# Patient Record
Sex: Female | Born: 1960 | Race: White | Hispanic: Yes | Marital: Single | Smoking: Never smoker
Health system: Southern US, Community
[De-identification: ages and names within clinical notes are randomized; demographics above are authoritative.]

## PROBLEM LIST (undated history)

## (undated) DIAGNOSIS — K219 Gastro-esophageal reflux disease without esophagitis: Secondary | ICD-10-CM

## (undated) DIAGNOSIS — Z780 Asymptomatic menopausal state: Secondary | ICD-10-CM

## (undated) DIAGNOSIS — I10 Essential (primary) hypertension: Secondary | ICD-10-CM

## (undated) HISTORY — PX: OTHER SURGICAL HISTORY: SHX169

---

## 2003-01-06 ENCOUNTER — Encounter (INDEPENDENT_AMBULATORY_CARE_PROVIDER_SITE_OTHER): Payer: Self-pay | Admitting: *Deleted

## 2003-01-06 ENCOUNTER — Other Ambulatory Visit: Admission: RE | Admit: 2003-01-06 | Discharge: 2003-01-06 | Payer: Self-pay | Admitting: Family Medicine

## 2003-01-06 ENCOUNTER — Encounter: Admission: RE | Admit: 2003-01-06 | Discharge: 2003-01-06 | Payer: Self-pay | Admitting: Family Medicine

## 2003-01-17 ENCOUNTER — Ambulatory Visit (HOSPITAL_COMMUNITY): Admission: RE | Admit: 2003-01-17 | Discharge: 2003-01-17 | Payer: Self-pay | Admitting: Family Medicine

## 2003-02-08 ENCOUNTER — Encounter: Admission: RE | Admit: 2003-02-08 | Discharge: 2003-02-08 | Payer: Self-pay | Admitting: Obstetrics and Gynecology

## 2003-02-09 ENCOUNTER — Ambulatory Visit (HOSPITAL_COMMUNITY): Admission: RE | Admit: 2003-02-09 | Discharge: 2003-02-09 | Payer: Self-pay | Admitting: Obstetrics and Gynecology

## 2003-02-09 ENCOUNTER — Encounter (INDEPENDENT_AMBULATORY_CARE_PROVIDER_SITE_OTHER): Payer: Self-pay

## 2003-03-08 ENCOUNTER — Encounter: Admission: RE | Admit: 2003-03-08 | Discharge: 2003-03-08 | Payer: Self-pay | Admitting: Obstetrics and Gynecology

## 2004-09-30 DIAGNOSIS — I10 Essential (primary) hypertension: Secondary | ICD-10-CM

## 2004-09-30 HISTORY — DX: Essential (primary) hypertension: I10

## 2009-03-25 ENCOUNTER — Emergency Department (HOSPITAL_COMMUNITY): Admission: EM | Admit: 2009-03-25 | Discharge: 2009-03-26 | Payer: Self-pay | Admitting: Emergency Medicine

## 2009-03-28 ENCOUNTER — Emergency Department (HOSPITAL_COMMUNITY): Admission: EM | Admit: 2009-03-28 | Discharge: 2009-03-28 | Payer: Self-pay | Admitting: Emergency Medicine

## 2011-01-07 LAB — CBC
MCV: 87.5 fL (ref 78.0–100.0)
RBC: 4.91 MIL/uL (ref 3.87–5.11)
WBC: 9 10*3/uL (ref 4.0–10.5)

## 2011-01-07 LAB — DIFFERENTIAL
Lymphs Abs: 1.7 10*3/uL (ref 0.7–4.0)
Monocytes Relative: 7 % (ref 3–12)
Neutro Abs: 6.5 10*3/uL (ref 1.7–7.7)
Neutrophils Relative %: 73 % (ref 43–77)

## 2011-01-07 LAB — COMPREHENSIVE METABOLIC PANEL
ALT: 16 U/L (ref 0–35)
Alkaline Phosphatase: 73 U/L (ref 39–117)
CO2: 29 mEq/L (ref 19–32)
Chloride: 111 mEq/L (ref 96–112)
GFR calc non Af Amer: >60 mL/min (ref >60)
Glucose, Bld: 97 mg/dL (ref 70–99)
Potassium: 3.8 mEq/L (ref 3.5–5.1)
Sodium: 146 mEq/L — ABNORMAL HIGH (ref 135–145)

## 2011-01-07 LAB — URINALYSIS, ROUTINE W REFLEX MICROSCOPIC
Bilirubin Urine: NEGATIVE
Glucose, UA: NEGATIVE mg/dL
Hgb urine dipstick: NEGATIVE
Ketones, ur: NEGATIVE mg/dL
Nitrite: NEGATIVE
Protein, ur: NEGATIVE mg/dL
Urobilinogen, UA: 0.2 mg/dL (ref 0.0–1.0)

## 2011-01-07 LAB — LIPASE, BLOOD: Lipase: 28 U/L (ref 11–59)

## 2011-01-07 LAB — URINE MICROSCOPIC-ADD ON

## 2011-02-15 NOTE — Op Note (Signed)
NAMECHRISTALYNN, Courtney Randolph                           ACCOUNT NO.:  [id number]   MEDICAL RECORD NO.:  [id number]                   PATIENT TYPE:  AMB   LOCATION:  SDC                                  FACILITY:  [facility]   PHYSICIAN:  Phil D. Okey Dupre, M.D.                  DATE OF BIRTH:  06/15/1961   DATE OF PROCEDURE:  02/09/2003  DATE OF DISCHARGE:                                 OPERATIVE REPORT   PROCEDURES:  1. Dilatation and curettage.  2. Submucous myomectomy.  3. Hysteroscopy.   SURGEON:  Javier Glazier. Okey Dupre, M.D.   ANESTHESIA:  Heavy MAC with paracervical.   OPERATIVE FINDINGS:  A 4 cm leiomyoma completely outside of the cervix with  a stalk that could be palpated midway up into the uterine cavity and  inserting on the anterior wall.   DESCRIPTION OF PROCEDURE:  Under satisfactory heavy MAC sedation, the  patient in the dorsal lithotomy position, the perineum and vagina were  prepped and draped in the usual sterile manner.  With bimanual pelvic  examination were able to see the aborting fibroid, follow the stalk up to  the mid anterior portion of the fundus, and the uterus was seen to be of  normal size and consistency with normal adnexa.  A weighted speculum was  placed up the posterior fourchette of the vagina, and the fibroid was  grasped with a single-tooth tenaculum.  Xylocaine 5 mL, 1%, was placed in  each of the paracervical areas at 8 and 4 o'clock for paracervical blockage.  An Endoloop was then placed around the fibroid and pushed up as high as  possible to the end of the stalk and pulled down as tight as possible.  Then  using a Mayo scissors, the fibroid was cut away from the stalk to be sent  for pathologic diagnosis.  The remaining portion of the stalk was then  twisted off with a polyp forceps and the entire uterine cavity curetted with  a small serrated curette.  This was followed by curettage with a small sharp  curette.  A large amount of endometrial tissue was sent for  pathologic  diagnosis.  The 30% hysteroscope was then inserted into the cavity to look  for the base of the stalk, and the area seemed to be smooth with no  significant amount of bleeding.  The tenaculum and speculum were removed  from the vagina.  The patient was transferred to the recovery room with a 20  mL blood loss, having tolerated the procedure well.  The endometrial  curettings as well as the leiomyoma were sent for pathologic diagnosis.                                               Michele Mcalpine  Amada Jupiter, M.D.    PDR/MEDQ  D:  02/09/2003  T:  02/10/2003  Job:  [id number]

## 2011-09-14 DIAGNOSIS — S301XXA Contusion of abdominal wall, initial encounter: Secondary | ICD-10-CM | POA: Insufficient documentation

## 2011-09-14 DIAGNOSIS — R109 Unspecified abdominal pain: Secondary | ICD-10-CM | POA: Insufficient documentation

## 2011-09-14 DIAGNOSIS — M549 Dorsalgia, unspecified: Secondary | ICD-10-CM | POA: Insufficient documentation

## 2011-09-15 ENCOUNTER — Encounter: Payer: Self-pay | Admitting: *Deleted

## 2011-09-15 ENCOUNTER — Emergency Department (HOSPITAL_COMMUNITY)
Admission: EM | Admit: 2011-09-15 | Discharge: 2011-09-15 | Disposition: A | Discharge status: Home / Self Care | Payer: Self-pay | Attending: Emergency Medicine | Admitting: Emergency Medicine

## 2011-09-15 DIAGNOSIS — S301XXA Contusion of abdominal wall, initial encounter: Secondary | ICD-10-CM

## 2011-09-15 MED ORDER — KETOROLAC TROMETHAMINE 60 MG/2ML IM SOLN
60.0000 mg | Freq: Once | INTRAMUSCULAR | Status: AC
  Administered 2011-09-15: 60 mg via INTRAMUSCULAR
  Filled 2011-09-15: qty 2

## 2011-09-15 MED ORDER — NAPROXEN 500 MG PO TABS: 500.0000 mg | ORAL_TABLET | Freq: Two times a day (BID) | ORAL | Status: AC
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
## 2011-12-18 ENCOUNTER — Other Ambulatory Visit (HOSPITAL_COMMUNITY): Payer: Self-pay | Admitting: Geriatric Medicine

## 2011-12-18 DIAGNOSIS — Z1231 Encounter for screening mammogram for malignant neoplasm of breast: Secondary | ICD-10-CM

## 2012-01-15 ENCOUNTER — Ambulatory Visit (HOSPITAL_COMMUNITY)
Admission: RE | Admit: 2012-01-15 | Discharge: 2012-01-15 | Disposition: A | Discharge status: Home / Self Care | Payer: No Typology Code available for payment source | Source: Ambulatory Visit | Attending: Geriatric Medicine | Admitting: Geriatric Medicine

## 2012-01-15 DIAGNOSIS — Z1231 Encounter for screening mammogram for malignant neoplasm of breast: Secondary | ICD-10-CM

## 2012-01-29 ENCOUNTER — Inpatient Hospital Stay (HOSPITAL_COMMUNITY): Payer: Self-pay

## 2012-01-29 ENCOUNTER — Inpatient Hospital Stay (HOSPITAL_COMMUNITY)
Admission: AD | Admit: 2012-01-29 | Discharge: 2012-01-29 | Disposition: A | Discharge status: Home / Self Care | Payer: Self-pay | Source: Ambulatory Visit | Attending: Obstetrics and Gynecology | Admitting: Obstetrics and Gynecology

## 2012-01-29 ENCOUNTER — Encounter (HOSPITAL_COMMUNITY): Payer: Self-pay | Admitting: *Deleted

## 2012-01-29 DIAGNOSIS — K8020 Calculus of gallbladder without cholecystitis without obstruction: Secondary | ICD-10-CM | POA: Insufficient documentation

## 2012-01-29 DIAGNOSIS — N912 Amenorrhea, unspecified: Secondary | ICD-10-CM | POA: Insufficient documentation

## 2012-01-29 DIAGNOSIS — R1011 Right upper quadrant pain: Secondary | ICD-10-CM

## 2012-01-29 HISTORY — DX: Essential (primary) hypertension: I10

## 2012-01-29 LAB — URINE MICROSCOPIC-ADD ON

## 2012-01-29 LAB — URINALYSIS, ROUTINE W REFLEX MICROSCOPIC
Glucose, UA: NEGATIVE mg/dL
Hgb urine dipstick: NEGATIVE
Specific Gravity, Urine: 1.015 (ref 1.005–1.030)
Urobilinogen, UA: 0.2 mg/dL (ref 0.0–1.0)

## 2012-01-29 LAB — CBC
MCV: 84.2 fL (ref 78.0–100.0)
Platelets: 219 10*3/uL (ref 150–400)
RBC: 5.14 MIL/uL — ABNORMAL HIGH (ref 3.87–5.11)
WBC: 8.7 10*3/uL (ref 4.0–10.5)

## 2012-01-29 LAB — HEPATIC FUNCTION PANEL
ALT: 15 U/L (ref 0–35)
AST: 15 U/L (ref 0–37)
Albumin: 3.9 g/dL (ref 3.5–5.2)
Bilirubin, Direct: <0.1 mg/dL (ref 0.0–0.3)

## 2012-01-29 LAB — POCT PREGNANCY, URINE: Preg Test, Ur: NEGATIVE

## 2012-01-29 MED ORDER — OXYCODONE-ACETAMINOPHEN 5-325 MG PO TABS: 1.0000 | ORAL_TABLET | Freq: Four times a day (QID) | ORAL | Status: AC | PRN

## 2012-01-29 NOTE — MAU Provider Note (Signed)
Courtney Randolph [id number] LMP 2 years ago. Post-menopausal Chief Complaint  Patient presents with  . Abdominal Pain     First Provider Initiated Contact with Patient 01/29/12 1049      SUBJECTIVE  HPI: Pt states she has a cyst in ruq that she has had for 2 years. States she's never had an ultrasound but states it has grown and is now painful. States she has had the pain x1 month and the pain now radiates towards her back.  She has taken Naproxen w/ good pain relief, but stopped taking due to stomach discomfort. She denies fever, chills, UTI Sx, hematuria, N/V/D/C, epigastric pain or relationship btw pain and eating.CT in 2010 for same compliant was normal.   Past Medical History  Diagnosis Date  . Hypertension    Past Surgical History  Procedure Date  . Uterine cyst removed    History   Social History  . Marital Status: Single    Spouse Name: N/A    Number of Children: N/A  . Years of Education: N/A   Occupational History  . Not on file.   Social History Main Topics  . Smoking status: Never Smoker   . Smokeless tobacco: Not on file  . Alcohol Use: No  . Drug Use: No  . Sexually Active: No   Other Topics Concern  . Not on file   Social History Narrative  . No narrative on file   No current facility-administered medications on file prior to encounter.   Current Outpatient Prescriptions on File Prior to Encounter  Medication Sig Dispense Refill  . naproxen (NAPROSYN) 500 MG tablet Take 1 tablet (500 mg total) by mouth 2 (two) times daily with a meal.  30 tablet  0   No Known Allergies  ROS: Pertinent items in HPI  OBJECTIVE Blood pressure 149/94, pulse 66, resp. rate 18, height 4\' 11"  (1.499 m), weight 68.04 kg (150 lb).  GENERAL: Well-developed, well-nourished female in mild distress.  HEENT: Normocephalic HEART: normal rate RESP: normal effort ABDOMEN: Soft, non-tender. No mass palpated at rest, w/ valsalva or when sitting. Pt unable to replicate bulging  of mass that she experienced at home. BS normal.  EXTREMITIES: Nontender, no edema NEURO: Alert and oriented SPECULUM EXAM: Deferred  LAB RESULTS Recent Results (from the past 336 hour(s))  URINALYSIS, ROUTINE W REFLEX MICROSCOPIC   Collection Time   01/29/12 10:20 AM      Component Value Range   Color, Urine YELLOW  YELLOW    APPearance CLEAR  CLEAR    Specific Gravity, Urine 1.015  1.005 - 1.030    pH 6.0  5.0 - 8.0    Glucose, UA NEGATIVE  NEGATIVE (mg/dL)   Hgb urine dipstick NEGATIVE  NEGATIVE    Bilirubin Urine NEGATIVE  NEGATIVE    Ketones, ur NEGATIVE  NEGATIVE (mg/dL)   Protein, ur NEGATIVE  NEGATIVE (mg/dL)   Urobilinogen, UA 0.2  0.0 - 1.0 (mg/dL)   Nitrite NEGATIVE  NEGATIVE    Leukocytes, UA TRACE (*) NEGATIVE   URINE MICROSCOPIC-ADD ON   Collection Time   01/29/12 10:20 AM      Component Value Range   Squamous Epithelial / LPF FEW (*) RARE    WBC, UA 0-2  <3 (WBC/hpf)   RBC / HPF 0-2  <3 (RBC/hpf)   Bacteria, UA FEW (*) RARE   POCT PREGNANCY, URINE   Collection Time   01/29/12 10:34 AM      Component Value Range   Preg  Test, Ur NEGATIVE  NEGATIVE   CBC   Collection Time   01/29/12 11:10 AM      Component Value Range   WBC 8.7  4.0 - 10.5 (K/uL)   RBC 5.14 (*) 3.87 - 5.11 (MIL/uL)   Hemoglobin 14.4  12.0 - 15.0 (g/dL)   HCT 16.1  09.6 - 04.5 (%)   MCV 84.2  78.0 - 100.0 (fL)   MCH 28.0  26.0 - 34.0 (pg)   MCHC 33.3  30.0 - 36.0 (g/dL)   RDW 40.9  81.1 - 91.4 (%)   Platelets 219  150 - 400 (K/uL)  HEPATIC FUNCTION PANEL   Collection Time   01/29/12 11:10 AM      Component Value Range   Total Protein 7.1  6.0 - 8.3 (g/dL)   Albumin 3.9  3.5 - 5.2 (g/dL)   AST 15  0 - 37 (U/L)   ALT 15  0 - 35 (U/L)   Alkaline Phosphatase 93  39 - 117 (U/L)   Total Bilirubin 0.5  0.3 - 1.2 (mg/dL)   Bilirubin, Direct <7.8  0.0 - 0.3 (mg/dL)   Indirect Bilirubin NOT CALCULATED  0.3 - 0.9 (mg/dL)   IMAGING US Abdomen Complete  01/29/2012  *RADIOLOGY REPORT*  Clinical Data:   Right abdominal pain and possible mass.  ABDOMINAL ULTRASOUND COMPLETE  Comparison:  None.  Findings:  Gallbladder:  A 4 mm echogenic focus is seen which is no longer demonstrated when the patient changes positions, likely representing a tiny mobile gallstone.  No evidence of gallbladder wall thickening or pericholecystic fluid.  Common Bile Duct:  Within normal limits in caliber. Measures 3 mm in diameter.  Liver: No focal mass lesion identified.  Within normal limits in parenchymal echogenicity.  IVC:  Appears normal.  Pancreas:  No abnormality identified.  Spleen:  Within normal limits in size and echotexture.  Right kidney:  Normal in size and parenchymal echogenicity.  No evidence of mass or hydronephrosis.  Left kidney:  Normal in size and parenchymal echogenicity.  No evidence of mass or hydronephrosis.  Abdominal Aorta:  No aneurysm identified.  IMPRESSION: Probable single tiny gallstone.  No sonographic signs of acute cholecystitis, biliary dilatation, or other significant abnormality.  Original Report Authenticated By: Danae Orleans, M.D.   ASSESSMENT 1. RUQ pain   of unknown etiology  PLAN D/C home Follow-up Information    Follow [facility] with [facility]   Contact information:   [address] [place] [address] [phone]      Follow up with [facility]. (As needed if symptoms worsen)    Contact information:   [address] [place] [address]         Medication List  As of 02/04/2012 12:04 AM   START taking these medications         oxyCODONE-acetaminophen 5-325 MG per tablet   Commonly known as: PERCOCET   Take 1-2 tablets by mouth every 6 (six) hours as needed for pain.         CONTINUE taking these medications         naproxen 500 MG tablet   Commonly known as: NAPROSYN   Take 1 tablet (500 mg total) by mouth 2 (two) times daily with a meal.          Where to get your medications    These are the prescriptions that  you need to pick up.   You may get these medications  from any pharmacy.         oxyCODONE-acetaminophen 5-325 MG per tablet           Take Naproxen w/ food and only when needed. Stop if stomach discomfort returns.  Dorathy Kinsman 01/29/2012 10:50 AM

## 2012-01-29 NOTE — MAU Note (Signed)
Pt states she has a cyst in ruq that she has had for 2 years.  States she's never had an ultrasound but states it has grown and is now painful.  States she has had the pain x1 month and the pain now radiates towards back.  Reports pain in left hip when walking.  LMP 2 years ago, assumes it is menopause.

## 2012-01-29 NOTE — Discharge Instructions (Signed)
Take Miralax as directed.

## 2012-02-05 NOTE — MAU Provider Note (Signed)
Agree with above note.  Courtney Randolph 02/05/2012 8:21 AM   

## 2012-05-14 ENCOUNTER — Encounter: Payer: Self-pay | Admitting: Family Medicine

## 2012-05-14 ENCOUNTER — Ambulatory Visit (INDEPENDENT_AMBULATORY_CARE_PROVIDER_SITE_OTHER): Payer: Self-pay | Admitting: Family Medicine

## 2012-05-14 VITALS — BP 136/84 | HR 80 | Temp 98.0°F | Ht 59.0 in | Wt 149.4 lb

## 2012-05-14 DIAGNOSIS — I10 Essential (primary) hypertension: Secondary | ICD-10-CM | POA: Insufficient documentation

## 2012-05-14 DIAGNOSIS — R109 Unspecified abdominal pain: Secondary | ICD-10-CM | POA: Insufficient documentation

## 2012-05-14 LAB — POCT URINALYSIS DIPSTICK
Bilirubin, UA: NEGATIVE
Glucose, UA: NEGATIVE
Ketones, UA: NEGATIVE
Leukocytes, UA: NEGATIVE

## 2012-05-14 MED ORDER — TRAMADOL HCL 50 MG PO TABS: 50.0000 mg | ORAL_TABLET | Freq: Three times a day (TID) | ORAL | Status: AC | PRN

## 2012-05-14 NOTE — Assessment & Plan Note (Signed)
On Lisinopril 10mg . Not sure who prescribed this medication, but patient's BP was 136/84 today. Will continue Lisinopril 10mg  for now. If patient develops HA, changes in vision, CP, SOB or leg swelling she should be evaluated.

## 2012-05-14 NOTE — Assessment & Plan Note (Signed)
UA ordered and no signs of UTI. Given tramadol for pain. Encouraged to use heat as there may be a MSK component. U/S and labs reviewed and were unremarkable. Patient does not have an acute abdomen. Will treat symptoms. Return to clinic soon for pelvic exam with GC/Ch cultures for possible pelvic etiology.

## 2012-05-14 NOTE — Progress Notes (Signed)
Subjective:     Patient ID: Courtney Randolph, female   DOB: [birth year], 51 y.o.   MRN: [id number]  HPI 51 yo F presenting to establish care. Never had PCP. Patient's nephew is present as interpreter. Patient has signed paper requesting he be present.  Abd Pain: A few months, RUQ radiates to back. Nothing makes it better, worse with working in tight clothes. Sitting a long time also makes it worse. Worse with eating. Causes nausea. Worse with greasy food. Evaluated at [facility] in May with an ultrasound. Unsure etiology, but not GYN issues per their evaluation. Patient also was evaluated in 2010 with CT scan and other work up which was unremarkable. Patient denies dysuria, vaginal discharge or any other gyn symptoms. Patient had IUD in 2010, but states she no longer has this. Her pain does not appear to interfere with her daily activities but it is always present.  HTN: Unsure of when and where she was diagnosed, but patient is on Lisinopril 10mg . She denies HA, changes in vision, CP or leg edema. She has no side effects from the medication. She does not check her BP at home.  Past Medical History  Diagnosis Date  . Hypertension    Past Surgical History  Procedure Date  . Uterine cyst removed     2006   Family History  Problem Relation Age of Onset  . Anesthesia problems Neg Hx    History   Social History Narrative   Lives alone in [place]. Has good support network in town including family members. Works at [employer].   Does not smoke, drink EtOH or use drugs.  Review of Systems See HPI above. Otherwise, ROS negative    Objective:   Physical Exam  Constitutional: She appears well-developed and well-nourished. No distress.  HENT:  Head: Normocephalic and atraumatic.  Neck: Normal range of motion.  Cardiovascular: Normal rate, regular rhythm and normal heart sounds.   Pulmonary/Chest: Effort normal and breath sounds normal. No respiratory distress.  Abdominal: Soft.  Bowel sounds are normal.       Obese abdomen. Soft, nondistended. She is TTP RUQ/RLQ but not exquisitely tender. Unable to identify one specific location of pain. No rebound. No CVA tenderness.  Musculoskeletal: Normal range of motion. She exhibits no edema.  Lymphadenopathy:    She has no cervical adenopathy.  Neurological: She is alert.  Skin: Skin is warm and dry. No rash noted.      Assessment:    51 yo F with HTN presenting for evaluation of abdominal pain   Plan:

## 2012-05-14 NOTE — Patient Instructions (Signed)
It was nice to meet you today.  I have changed your pain medication. Please make an appointment to come back to see me soon for a pelvic exam to evaluate this pain. I do not think you need surgery right now, but we will continue to try to treat your pain.   It also seems like you may have some muscle strain in your back causing your pain. You can use a heating pad for this.  I will see you in 1-2 months or sooner, if needed.   Amber M. Hairford, M.D.

## 2012-06-04 ENCOUNTER — Ambulatory Visit (INDEPENDENT_AMBULATORY_CARE_PROVIDER_SITE_OTHER): Payer: Self-pay | Admitting: Family Medicine

## 2012-06-04 DIAGNOSIS — I10 Essential (primary) hypertension: Secondary | ICD-10-CM

## 2012-06-04 DIAGNOSIS — R109 Unspecified abdominal pain: Secondary | ICD-10-CM

## 2012-06-04 NOTE — Progress Notes (Signed)
Subjective:     Patient ID: Courtney Randolph, female   DOB: 1961-03-11, 51 y.o.   MRN: [id number]  HPI Pt is a 51 yo female presenting for follow up on her abd pain. [employer] was available as an Equities trader for the entire interview and exam  Pain not better. Tried Tramadol which only helped some. Able to eat, but feels like she has some heartburn which is not related to current pain. Takes alkaseltzer some times but does not help. Still washing dishes but does not hurt her abdomen more with twisting. No other exercise or heavy lifting. Reports pain in RUQ that radiates to her back. She feels a "lump" but it is not visible. She does wear some tight clothing at work, but not at home and the pain is always there. She does not report a rash or sensitivity of the skin.   History reviewed: Nonsmoker  Review of Systems + pain, no N/V/D, no rash    Objective:   Physical Exam Gen: Awake, sitting on table, leaning to the left HEENT: AT, [place] Heart: RRR Lungs: CTAB Abd: No visible rash. Not very TTP. No tenderness of ribs. No masses or lumps appreciated. Ext: No edema    Assessment:     51 yo F with chronic abdominal pain    Plan:     See problem list

## 2012-06-04 NOTE — Patient Instructions (Addendum)
Thanks for coming in.  Please get your orange card. Then go to hospital and get an Xray. Please ask your pharmacist for the generic Prilosec for your heart burn. You can take ibuprofen 2 tablets up to 4 times per day, if needed for pain.  Please come back to see me in 1-2 months after you get orange card.  Thanks, [employer]. Trevis Eden, M.D.

## 2012-06-05 ENCOUNTER — Encounter: Payer: Self-pay | Admitting: Family Medicine

## 2012-06-05 NOTE — Assessment & Plan Note (Signed)
Unremarkable abdominal exam. Patient awaiting orange card. Will do pelvic exam with cultures after orange card. Also, patient will likely benefit from a HIDA scan to see if the EF from her gallbladder is low which could be causing her pain since no stone was found. If this is the case, she will need a cholecystectomy

## 2012-06-19 ENCOUNTER — Other Ambulatory Visit (HOSPITAL_COMMUNITY)
Admission: RE | Admit: 2012-06-19 | Discharge: 2012-06-19 | Disposition: A | Discharge status: Home / Self Care | Payer: Self-pay | Source: Ambulatory Visit | Attending: Family Medicine | Admitting: Family Medicine

## 2012-06-19 ENCOUNTER — Ambulatory Visit (INDEPENDENT_AMBULATORY_CARE_PROVIDER_SITE_OTHER): Payer: Self-pay | Admitting: Family Medicine

## 2012-06-19 ENCOUNTER — Encounter: Payer: Self-pay | Admitting: Family Medicine

## 2012-06-19 VITALS — BP 117/75 | HR 65 | Temp 98.5°F | Ht 59.0 in | Wt 147.7 lb

## 2012-06-19 DIAGNOSIS — Z23 Encounter for immunization: Secondary | ICD-10-CM

## 2012-06-19 DIAGNOSIS — Z113 Encounter for screening for infections with a predominantly sexual mode of transmission: Secondary | ICD-10-CM | POA: Insufficient documentation

## 2012-06-19 DIAGNOSIS — R109 Unspecified abdominal pain: Secondary | ICD-10-CM

## 2012-06-19 MED ORDER — HYDROCODONE-ACETAMINOPHEN 5-500 MG PO TABS: 1.0000 | ORAL_TABLET | Freq: Three times a day (TID) | ORAL | Status: DC | PRN

## 2012-06-19 NOTE — Progress Notes (Signed)
Patient ID: Courtney Randolph, female   DOB: Aug 29, 1961, 51 y.o.   MRN: [id number] [facility] Ramina Hulet M. Selia Wareing, MD Phone: [phone]  Subjective: HPI: Patient is a 51 y.o. female presenting to clinic today for abdominal pain. Interpreter present throughout history and physical.  1. Patient is being evaluated again for the same abdominal pain. Always has RUQ pain and radiates to the back. Unchanged. Her ribs do not hurt, but now it does radiate down her back more. She now has the [facility] card and is returning for further evaluation including pelvic exam and possible HIDA scan. Appetite is normal. Not affected by work. Nothing makes it better or worse. She describes pain as 8/10 today. She does take some pain medication but she states it makes her eyes red. No fevers, epigastric pain improved, only pain in RUQ.  Some nausea, no vomiting. No vaginal discharge.  History Reviewed: No smoker. Health Maintenance: Needs flu shot; will get today.   ROS: Please see HPI above.  Objective: Office vital signs reviewed.  Physical Examination:  General: Awake, alert. Appears uncomfortable HEENT: Atraumatic, normocephalic. Sclera injected bilaterally.  Pulm: CTAB, no wheezes. Normal work of breathing Cardio: RRR, no murmurs appreciated Abdomen:+BS, soft. No TTP of epigastrum. Exquisite pain to palpation of RUQ, right flank and RLQ. No rebound. No guarding. Unable to palpate liver edge. GU: Normal vaginal. No discharge. Unable to visualize cervix. GC/Ch cultures obtained Extremities: No edema Neuro: Grossly intact  Assessment: 51 yo Hispanic female with abdominal pain  Plan: See Problem List and After Visit Summary

## 2012-06-19 NOTE — Assessment & Plan Note (Signed)
Continues to have same pain, unchanged. The tramadol I gave her for musculoskeletal pain did not help. Only a small gallstone seen on ultrasound. I did pelvic today which was unremarkable and did Gc/Ch cultures. I think this is likely secondary to her gallbladder although her LFts were normal. Will refer to surgery for further evaluation and hopefully they will be able to arrange an outpatient HIDA scan for her. We discussed this, and she agrees. I have her #20 of Vicodin to take only as needed for pain and when she could rest. If surgery work up is unremarkable, will also consider evaluating pulmonary etiology of pain given the close proximity to her diaphragm.

## 2012-06-19 NOTE — Patient Instructions (Signed)
It was good to see you today. I am sorry you are still having pain. I will call you if your tests today are abnormal. I am giving you a short trial of pain medication. Only take this when you can rest. I do not want you to take this long term, but I can tell your abdomen is really bothering you. Also, I am putting in a referral for the surgeons to work up your pain as well.  Please come back to see me in 1-2 months.  Roby Spalla M. Colbert Curenton, M.D.

## 2012-07-03 ENCOUNTER — Encounter (INDEPENDENT_AMBULATORY_CARE_PROVIDER_SITE_OTHER): Payer: Self-pay | Admitting: Surgery

## 2012-07-03 ENCOUNTER — Ambulatory Visit (INDEPENDENT_AMBULATORY_CARE_PROVIDER_SITE_OTHER): Payer: PRIVATE HEALTH INSURANCE | Admitting: Surgery

## 2012-07-03 VITALS — BP 128/82 | HR 64 | Temp 98.4°F | Resp 14 | Ht 60.0 in | Wt 149.2 lb

## 2012-07-03 DIAGNOSIS — R1900 Intra-abdominal and pelvic swelling, mass and lump, unspecified site: Secondary | ICD-10-CM

## 2012-07-03 DIAGNOSIS — R222 Localized swelling, mass and lump, trunk: Secondary | ICD-10-CM

## 2012-07-03 NOTE — Progress Notes (Signed)
Patient ID: Courtney Randolph, female   DOB: 02-Dec-1960, 51 y.o.   MRN: [id number]  Chief Complaint  Patient presents with  . Other    gallbladder problems    HPI Courtney Randolph is a 51 y.o. female.    HPI She is here today referred by [facility] family practice for evaluation of right-sided abdominal pain. She has been hurting for approximately 2 years and reports that she has a mass on her right flank. She feels that this is causing the pain. She does not speak any English and this is done through an interpreter. She does have some fatty food intolerance and has had an ultrasound showing cholelithiasis. No abdominal masses were seen on ultrasound or previous CAT scan. The pain is worse with motion. It is described as sharp and worsening. Past Medical History  Diagnosis Date  . Hypertension     Past Surgical History  Procedure Date  . Uterine cyst removed     2006    Family History  Problem Relation Age of Onset  . Anesthesia problems Neg Hx     Social History History  Substance Use Topics  . Smoking status: Never Smoker   . Smokeless tobacco: Not on file  . Alcohol Use: No    No Known Allergies  Current Outpatient Prescriptions  Medication Sig Dispense Refill  . HYDROcodone-acetaminophen (VICODIN) 5-500 MG per tablet Take 1 tablet by mouth every 8 (eight) hours as needed for pain.  20 tablet  0  . lisinopril (PRINIVIL,ZESTRIL) 10 MG tablet Take 10 mg by mouth daily.      . naproxen (NAPROSYN) 500 MG tablet Take 1 tablet (500 mg total) by mouth 2 (two) times daily with a meal.  30 tablet  0  . traMADol (ULTRAM) 50 MG tablet Take 50 mg by mouth every 6 (six) hours as needed.        Review of Systems Review of Systems  Constitutional: Negative for fever, chills and unexpected weight change.  HENT: Negative for hearing loss, congestion, sore throat, trouble swallowing and voice change.   Eyes: Negative for visual disturbance.  Respiratory: Negative for cough and wheezing.     Cardiovascular: Negative for chest pain, palpitations and leg swelling.  Gastrointestinal: Positive for nausea and abdominal pain. Negative for vomiting, diarrhea, constipation, blood in stool, abdominal distention and anal bleeding.  Genitourinary: Negative for hematuria, vaginal bleeding and difficulty urinating.  Musculoskeletal: Negative for arthralgias.  Skin: Negative for rash and wound.  Neurological: Negative for seizures, syncope and headaches.  Hematological: Negative for adenopathy. Does not bruise/bleed easily.  Psychiatric/Behavioral: Negative for confusion.    Blood pressure 128/82, pulse 64, temperature 98.4 F (36.9 C), resp. rate 14, height 5' (1.524 m), weight 149 lb 3.2 oz (67.677 kg).  Physical Exam Physical Exam  Constitutional: She is oriented to person, place, and time. She appears well-developed and well-nourished. No distress.  HENT:  Head: Normocephalic and atraumatic.  Right Ear: External ear normal.  Left Ear: External ear normal.  Nose: Nose normal.  Mouth/Throat: Oropharynx is clear and moist.  Eyes: Conjunctivae normal are normal. Pupils are equal, round, and reactive to light. Right eye exhibits no discharge. Left eye exhibits no discharge. No scleral icterus.  Neck: Normal range of motion. Neck supple. No tracheal deviation present. No thyromegaly present.  Cardiovascular: Normal rate, regular rhythm, normal heart sounds and intact distal pulses.   No murmur heard. Pulmonary/Chest: Effort normal and breath sounds normal. No respiratory distress. She has no wheezes. She  has no rales.  Abdominal: Soft. Bowel sounds are normal. She exhibits no distension. There is no tenderness. There is no rebound.       There is a 5 cm mass deep in the subcutaneous tissue of the right upper quadrant/flank just below the costal margin.  Musculoskeletal: Normal range of motion. She exhibits no edema and no tenderness.  Lymphadenopathy:    She has no cervical adenopathy.   Neurological: She is alert and oriented to person, place, and time.  Skin: Skin is warm and dry. No rash noted. She is not diaphoretic. No erythema.  Psychiatric: Her behavior is normal. Judgment normal.    Data Reviewed   Assessment    A 5 cm right flank mass    Plan    Although she does have gallstones, she is convinced the mass at the cause of her discomfort which I feel is reasonable. This may represent a lipoma. She was to have this removed which I feel again is necessary for histologic evaluation. I discussed risks of surgery which includes but is not limited to bleeding, infection, recurrence, and the chances may not resolve all her symptoms. She understands and wishes to proceed. Surgery will be scheduled. Likely Of success is moderate       Courtney Randolph A 07/03/2012, 4:23 PM

## 2012-07-16 ENCOUNTER — Encounter (HOSPITAL_BASED_OUTPATIENT_CLINIC_OR_DEPARTMENT_OTHER): Payer: Self-pay | Admitting: *Deleted

## 2012-07-16 NOTE — Progress Notes (Signed)
Uses a spanish interpretor for call-to come in for [id number]-pt goes to [facility]- Sister to bring her dos-will have an interpretor

## 2012-07-17 ENCOUNTER — Encounter (HOSPITAL_BASED_OUTPATIENT_CLINIC_OR_DEPARTMENT_OTHER)
Admission: RE | Admit: 2012-07-17 | Discharge: 2012-07-17 | Disposition: A | Discharge status: Home / Self Care | Payer: Self-pay | Source: Ambulatory Visit | Attending: Surgery | Admitting: Surgery

## 2012-07-17 ENCOUNTER — Other Ambulatory Visit: Payer: Self-pay

## 2012-07-17 LAB — BASIC METABOLIC PANEL
CO2: 28 mEq/L (ref 19–32)
Calcium: 9.2 mg/dL (ref 8.4–10.5)
Chloride: 104 mEq/L (ref 96–112)
GFR calc Af Amer: >90 mL/min (ref >90)
GFR calc non Af Amer: >90 mL/min (ref >90)
Sodium: 141 mEq/L (ref 135–145)

## 2012-07-21 NOTE — H&P (Signed)
Patient ID: Courtney Randolph, female DOB: August 18, 1961, 51 y.o. MRN: [id number]  Chief Complaint   Patient presents with   .  Other     gallbladder problems    HPI  Courtney Randolph is a 51 y.o. female.  HPI  She is here today referred by [facility] family practice for evaluation of right-sided abdominal pain. She has been hurting for approximately 2 years and reports that she has a mass on her right flank. She feels that this is causing the pain. She does not speak any English and this is done through an interpreter. She does have some fatty food intolerance and has had an ultrasound showing cholelithiasis. No abdominal masses were seen on ultrasound or previous CAT scan. The pain is worse with motion. It is described as sharp and worsening.  Past Medical History   Diagnosis  Date   .  Hypertension     Past Surgical History   Procedure  Date   .  Uterine cyst removed      2006    Family History   Problem  Relation  Age of Onset   .  Anesthesia problems  Neg Hx     Social History  History   Substance Use Topics   .  Smoking status:  Never Smoker   .  Smokeless tobacco:  Not on file   .  Alcohol Use:  No    No Known Allergies  Current Outpatient Prescriptions   Medication  Sig  Dispense  Refill   .  HYDROcodone-acetaminophen (VICODIN) 5-500 MG per tablet  Take 1 tablet by mouth every 8 (eight) hours as needed for pain.  20 tablet  0   .  lisinopril (PRINIVIL,ZESTRIL) 10 MG tablet  Take 10 mg by mouth daily.     .  naproxen (NAPROSYN) 500 MG tablet  Take 1 tablet (500 mg total) by mouth 2 (two) times daily with a meal.  30 tablet  0   .  traMADol (ULTRAM) 50 MG tablet  Take 50 mg by mouth every 6 (six) hours as needed.      Review of Systems  Review of Systems  Constitutional: Negative for fever, chills and unexpected weight change.  HENT: Negative for hearing loss, congestion, sore throat, trouble swallowing and voice change.  Eyes: Negative for visual disturbance.  Respiratory:  Negative for cough and wheezing.  Cardiovascular: Negative for chest pain, palpitations and leg swelling.  Gastrointestinal: Positive for nausea and abdominal pain. Negative for vomiting, diarrhea, constipation, blood in stool, abdominal distention and anal bleeding.  Genitourinary: Negative for hematuria, vaginal bleeding and difficulty urinating.  Musculoskeletal: Negative for arthralgias.  Skin: Negative for rash and wound.  Neurological: Negative for seizures, syncope and headaches.  Hematological: Negative for adenopathy. Does not bruise/bleed easily.  Psychiatric/Behavioral: Negative for confusion.   Blood pressure 128/82, pulse 64, temperature 98.4 F (36.9 C), resp. rate 14, height 5' (1.524 m), weight 149 lb 3.2 oz (67.677 kg).  Physical Exam  Physical Exam  Constitutional: She is oriented to person, place, and time. She appears well-developed and well-nourished. No distress.  HENT:  Head: Normocephalic and atraumatic.  Right Ear: External ear normal.  Left Ear: External ear normal.  Nose: Nose normal.  Mouth/Throat: Oropharynx is clear and moist.  Eyes: Conjunctivae normal are normal. Pupils are equal, round, and reactive to light. Right eye exhibits no discharge. Left eye exhibits no discharge. No scleral icterus.  Neck: Normal range of motion. Neck supple. No tracheal deviation  present. No thyromegaly present.  Cardiovascular: Normal rate, regular rhythm, normal heart sounds and intact distal pulses.  No murmur heard.  Pulmonary/Chest: Effort normal and breath sounds normal. No respiratory distress. She has no wheezes. She has no rales.  Abdominal: Soft. Bowel sounds are normal. She exhibits no distension. There is no tenderness. There is no rebound.  There is a 5 cm mass deep in the subcutaneous tissue of the right upper quadrant/flank just below the costal margin.  Musculoskeletal: Normal range of motion. She exhibits no edema and no tenderness.  Lymphadenopathy:  She has  no cervical adenopathy.  Neurological: She is alert and oriented to person, place, and time.  Skin: Skin is warm and dry. No rash noted. She is not diaphoretic. No erythema.  Psychiatric: Her behavior is normal. Judgment normal.   Data Reviewed  Assessment   A 5 cm right flank mass   Plan   Although she does have gallstones, she is convinced the mass at the cause of her discomfort which I feel is reasonable. This may represent a lipoma. She was to have this removed which I feel again is necessary for histologic evaluation. I discussed risks of surgery which includes but is not limited to bleeding, infection, recurrence, and the chances may not resolve all her symptoms. She understands and wishes to proceed. Surgery will be scheduled. Likely Of success is moderate   Aaryn Sermon A

## 2012-07-22 ENCOUNTER — Encounter (HOSPITAL_BASED_OUTPATIENT_CLINIC_OR_DEPARTMENT_OTHER)
Admission: RE | Disposition: A | Discharge status: Home / Self Care | Payer: Self-pay | Source: Ambulatory Visit | Attending: Surgery

## 2012-07-22 ENCOUNTER — Ambulatory Visit (HOSPITAL_BASED_OUTPATIENT_CLINIC_OR_DEPARTMENT_OTHER)
Admission: RE | Admit: 2012-07-22 | Discharge: 2012-07-22 | Disposition: A | Discharge status: Home / Self Care | Payer: Self-pay | Source: Ambulatory Visit | Attending: Surgery | Admitting: Surgery

## 2012-07-22 ENCOUNTER — Encounter (HOSPITAL_BASED_OUTPATIENT_CLINIC_OR_DEPARTMENT_OTHER): Payer: Self-pay | Admitting: Anesthesiology

## 2012-07-22 ENCOUNTER — Encounter (HOSPITAL_BASED_OUTPATIENT_CLINIC_OR_DEPARTMENT_OTHER): Payer: Self-pay | Admitting: *Deleted

## 2012-07-22 ENCOUNTER — Ambulatory Visit (HOSPITAL_BASED_OUTPATIENT_CLINIC_OR_DEPARTMENT_OTHER): Payer: Self-pay | Admitting: Anesthesiology

## 2012-07-22 DIAGNOSIS — Z01812 Encounter for preprocedural laboratory examination: Secondary | ICD-10-CM | POA: Insufficient documentation

## 2012-07-22 DIAGNOSIS — R1909 Other intra-abdominal and pelvic swelling, mass and lump: Secondary | ICD-10-CM | POA: Insufficient documentation

## 2012-07-22 DIAGNOSIS — D1739 Benign lipomatous neoplasm of skin and subcutaneous tissue of other sites: Secondary | ICD-10-CM

## 2012-07-22 DIAGNOSIS — I10 Essential (primary) hypertension: Secondary | ICD-10-CM | POA: Insufficient documentation

## 2012-07-22 DIAGNOSIS — Z01810 Encounter for preprocedural cardiovascular examination: Secondary | ICD-10-CM | POA: Insufficient documentation

## 2012-07-22 HISTORY — PX: MASS EXCISION: SHX2000

## 2012-07-22 HISTORY — DX: Asymptomatic menopausal state: Z78.0

## 2012-07-22 LAB — POCT HEMOGLOBIN-HEMACUE: Hemoglobin: 15 g/dL (ref 12.0–15.0)

## 2012-07-22 SURGERY — EXCISION MASS
Anesthesia: General | Site: Abdomen | Laterality: Right | Wound class: Clean

## 2012-07-22 MED ORDER — FENTANYL CITRATE 0.05 MG/ML IJ SOLN
INTRAMUSCULAR | Status: DC | PRN
  Administered 2012-07-22: 75 ug via INTRAVENOUS

## 2012-07-22 MED ORDER — PROPOFOL 10 MG/ML IV BOLUS
INTRAVENOUS | Status: DC | PRN
  Administered 2012-07-22: 200 mg via INTRAVENOUS

## 2012-07-22 MED ORDER — CEFAZOLIN SODIUM-DEXTROSE 2-3 GM-% IV SOLR
2.0000 g | INTRAVENOUS | Status: AC
  Administered 2012-07-22: 2 g via INTRAVENOUS

## 2012-07-22 MED ORDER — EPHEDRINE SULFATE 50 MG/ML IJ SOLN
INTRAMUSCULAR | Status: DC | PRN
  Administered 2012-07-22: 10 mg via INTRAVENOUS

## 2012-07-22 MED ORDER — HYDROCODONE-ACETAMINOPHEN 5-500 MG PO TABS: 1.0000 | ORAL_TABLET | Freq: Three times a day (TID) | ORAL | Status: DC | PRN

## 2012-07-22 MED ORDER — ONDANSETRON HCL 4 MG/2ML IJ SOLN
INTRAMUSCULAR | Status: DC | PRN
  Administered 2012-07-22: 4 mg via INTRAVENOUS

## 2012-07-22 MED ORDER — LIDOCAINE HCL (CARDIAC) 20 MG/ML IV SOLN
INTRAVENOUS | Status: DC | PRN
  Administered 2012-07-22: 50 mg via INTRAVENOUS

## 2012-07-22 MED ORDER — BUPIVACAINE HCL (PF) 0.5 % IJ SOLN
INTRAMUSCULAR | Status: DC | PRN
  Administered 2012-07-22: 18 mL

## 2012-07-22 MED ORDER — DEXAMETHASONE SODIUM PHOSPHATE 4 MG/ML IJ SOLN
INTRAMUSCULAR | Status: DC | PRN
  Administered 2012-07-22: 5 mg via INTRAVENOUS

## 2012-07-22 MED ORDER — LACTATED RINGERS IV SOLN
INTRAVENOUS | Status: DC
  Administered 2012-07-22: 12:00:00 via INTRAVENOUS

## 2012-07-22 MED ORDER — MIDAZOLAM HCL 5 MG/5ML IJ SOLN
INTRAMUSCULAR | Status: DC | PRN
  Administered 2012-07-22: 1.5 mg via INTRAVENOUS

## 2012-07-22 SURGICAL SUPPLY — 40 items
BENZOIN TINCTURE PRP APPL 2/3 (GAUZE/BANDAGES/DRESSINGS) ×2 IMPLANT
BLADE HEX COATED 2.75 (ELECTRODE) ×2 IMPLANT
BLADE SURG 15 STRL LF DISP TIS (BLADE) ×1 IMPLANT
BLADE SURG 15 STRL SS (BLADE) ×1
BLADE SURG ROTATE 9660 (MISCELLANEOUS) IMPLANT
CANISTER SUCTION 1200CC (MISCELLANEOUS) IMPLANT
CHLORAPREP W/TINT 26ML (MISCELLANEOUS) ×2 IMPLANT
CLOTH BEACON ORANGE TIMEOUT ST (SAFETY) ×2 IMPLANT
COVER MAYO STAND STRL (DRAPES) ×2 IMPLANT
COVER TABLE BACK 60X90 (DRAPES) ×2 IMPLANT
DECANTER SPIKE VIAL GLASS SM (MISCELLANEOUS) IMPLANT
DRAPE PED LAPAROTOMY (DRAPES) ×2 IMPLANT
DRAPE UTILITY XL STRL (DRAPES) ×2 IMPLANT
DRSG TEGADERM 4X4.75 (GAUZE/BANDAGES/DRESSINGS) ×2 IMPLANT
ELECT REM PT RETURN 9FT ADLT (ELECTROSURGICAL) ×2
ELECTRODE REM PT RTRN 9FT ADLT (ELECTROSURGICAL) ×1 IMPLANT
GAUZE SPONGE 4X4 12PLY STRL LF (GAUZE/BANDAGES/DRESSINGS) ×2 IMPLANT
GLOVE ECLIPSE 6.5 STRL STRAW (GLOVE) ×2 IMPLANT
GLOVE SURG SIGNA 7.5 PF LTX (GLOVE) ×2 IMPLANT
GOWN PREVENTION PLUS XLARGE (GOWN DISPOSABLE) ×2 IMPLANT
GOWN PREVENTION PLUS XXLARGE (GOWN DISPOSABLE) ×2 IMPLANT
NEEDLE HYPO 25X1 1.5 SAFETY (NEEDLE) ×2 IMPLANT
NS IRRIG 1000ML POUR BTL (IV SOLUTION) IMPLANT
PACK BASIN DAY SURGERY FS (CUSTOM PROCEDURE TRAY) ×2 IMPLANT
PENCIL BUTTON HOLSTER BLD 10FT (ELECTRODE) ×2 IMPLANT
SLEEVE SCD COMPRESS KNEE MED (MISCELLANEOUS) IMPLANT
SPONGE LAP 4X18 X RAY DECT (DISPOSABLE) ×2 IMPLANT
STRIP CLOSURE SKIN 1/2X4 (GAUZE/BANDAGES/DRESSINGS) ×2 IMPLANT
SUT MNCRL AB 4-0 PS2 18 (SUTURE) ×2 IMPLANT
SUT VIC AB 2-0 SH 27 (SUTURE)
SUT VIC AB 2-0 SH 27XBRD (SUTURE) IMPLANT
SUT VIC AB 3-0 SH 27 (SUTURE) ×1
SUT VIC AB 3-0 SH 27X BRD (SUTURE) ×1 IMPLANT
SYR BULB 3OZ (MISCELLANEOUS) IMPLANT
SYR CONTROL 10ML LL (SYRINGE) ×2 IMPLANT
TOWEL OR 17X24 6PK STRL BLUE (TOWEL DISPOSABLE) ×2 IMPLANT
TOWEL OR NON WOVEN STRL DISP B (DISPOSABLE) ×2 IMPLANT
TUBE CONNECTING 20X1/4 (TUBING) IMPLANT
WATER STERILE IRR 1000ML POUR (IV SOLUTION) IMPLANT
YANKAUER SUCT BULB TIP NO VENT (SUCTIONS) IMPLANT

## 2012-07-22 NOTE — Transfer of Care (Signed)
Immediate Anesthesia Transfer of Care Note  Patient: Courtney Randolph  Procedure(s) Performed: Procedure(s) (LRB) with comments: EXCISION MASS (Right) - excision right abdominal wall mass  Patient Location: PACU  Anesthesia Type: General  Level of Consciousness: awake and alert   Airway & Oxygen Therapy: Patient Spontanous Breathing and Patient connected to face mask oxygen  Post-op Assessment: Report given to PACU RN and Post -op Vital signs reviewed and stable  Post vital signs: Reviewed and stable  Complications: No apparent anesthesia complications

## 2012-07-22 NOTE — Anesthesia Procedure Notes (Signed)
Procedure Name: LMA Insertion Date/Time: 07/22/2012 11:55 AM Performed by: Caren Macadam Pre-anesthesia Checklist: Patient identified, Emergency Drugs available, Suction available and Patient being monitored Patient Re-evaluated:Patient Re-evaluated prior to inductionOxygen Delivery Method: Circle System Utilized Preoxygenation: Pre-oxygenation with 100% oxygen Intubation Type: IV induction Ventilation: Mask ventilation without difficulty LMA: LMA inserted LMA Size: 4.0 Number of attempts: 1 Airway Equipment and Method: bite block Placement Confirmation: positive ETCO2 and breath sounds checked- equal and bilateral Tube secured with: Tape Dental Injury: Teeth and Oropharynx as per pre-operative assessment

## 2012-07-22 NOTE — Anesthesia Preprocedure Evaluation (Signed)
Anesthesia Evaluation  Patient identified by MRN, date of birth, ID band Patient awake    Reviewed: Allergy & Precautions, H&P , NPO status , Patient's Chart, lab work & pertinent test results  Airway Mallampati: II  Neck ROM: full    Dental   Pulmonary          Cardiovascular hypertension,     Neuro/Psych    GI/Hepatic   Endo/Other    Renal/GU      Musculoskeletal   Abdominal   Peds  Hematology   Anesthesia Other Findings   Reproductive/Obstetrics                           Anesthesia Physical Anesthesia Plan  ASA: II  Anesthesia Plan: General   Post-op Pain Management:    Induction: Intravenous  Airway Management Planned: LMA  Additional Equipment:   Intra-op Plan:   Post-operative Plan:   Informed Consent: I have reviewed the patients History and Physical, chart, labs and discussed the procedure including the risks, benefits and alternatives for the proposed anesthesia with the patient or authorized representative who has indicated his/her understanding and acceptance.     Plan Discussed with: CRNA and Surgeon  Anesthesia Plan Comments:         Anesthesia Quick Evaluation

## 2012-07-22 NOTE — Op Note (Signed)
EXCISION MASS  Procedure Note  Courtney Randolph 07/22/2012   Pre-op Diagnosis: right side abdominal wall mass     Post-op Diagnosis: same  Procedure(s): EXCISION 5 CM RIGHT SIDED ABDOMINAL WALL MASS  Surgeon(s): Shelly Rubenstein, MD  Anesthesia: General  Staff:  Chrystine Oiler, RN - Circulator Idell Pickles, CST - Scrub Person  Estimated Blood Loss: Minimal               Specimens: sent to path.  Probable lipoma         Procedure: The patient was brought to the operating room and identified as the correct patient. She was placed supine on the operating room table Gen. Anesthesia was induced. Her abdomen was then prepped and draped in the usual sterile fashion. I anesthetized the skin over the palpable mass with half percent Marcaine. I made a transverse incision with scalpel. I took this down to the subjacent tissue with electrocautery. The mass appeared consistent with a lipoma it was excised in its entirety with electrocautery. It was approximate 5 cm in size. Once it was removed it was sent to pathology for evaluation. Irrigated with saline. Anesthetize it for marking. I closed the subcutaneous tissue with interrupted 3-0 Vicryl sutures and closed the skin with a running 4-0 Monocryl. Steri-Strips, gauze, and and Tegaderm were then applied. The patient tolerated the procedure well. All the counts were correct at the end of the procedure. The patient was then extubated in the operating room and taken in stable condition to the recovery room. Joselynn Amoroso A   Date: 07/22/2012  Time: 12:17 PM

## 2012-07-22 NOTE — Progress Notes (Signed)
Interpreter Hardeep Reetz Namihira for pre surgery   °

## 2012-07-22 NOTE — Anesthesia Postprocedure Evaluation (Signed)
Anesthesia Post Note  Patient: Courtney Randolph  Procedure(s) Performed: Procedure(s) (LRB): EXCISION MASS (Right)  Anesthesia type: General  Patient location: PACU  Post pain: Pain level controlled and Adequate analgesia  Post assessment: Post-op Vital signs reviewed, Patient's Cardiovascular Status Stable, Respiratory Function Stable, Patent Airway and Pain level controlled  Last Vitals:  Filed Vitals:   07/22/12 1245  BP: 109/67  Pulse:   Temp:   Resp:     Post vital signs: Reviewed and stable  Level of consciousness: awake, alert  and oriented  Complications: No apparent anesthesia complications

## 2012-07-22 NOTE — Progress Notes (Signed)
Interpreter Wyvonnia Dusky for Postsurgery DR Magnus Ivan

## 2012-07-22 NOTE — Interval H&P Note (Signed)
History and Physical Interval Note: no change in H and P  07/22/2012 11:11 AM  Courtney Randolph  has presented today for surgery, with the diagnosis of right side abdominal wall mass  The various methods of treatment have been discussed with the patient and family. After consideration of risks, benefits and other options for treatment, the patient has consented to  Procedure(s) (LRB) with comments: EXCISION MASS (Right) - excision right abdominal wall mass as a surgical intervention .  The patient's history has been reviewed, patient examined, no change in status, stable for surgery.  I have reviewed the patient's chart and labs.  Questions were answered to the patient's satisfaction.     Aala Ransom A

## 2012-07-23 ENCOUNTER — Telehealth (INDEPENDENT_AMBULATORY_CARE_PROVIDER_SITE_OTHER): Payer: Self-pay

## 2012-07-23 ENCOUNTER — Encounter (HOSPITAL_BASED_OUTPATIENT_CLINIC_OR_DEPARTMENT_OTHER): Payer: Self-pay | Admitting: Surgery

## 2012-07-23 NOTE — Telephone Encounter (Signed)
I spoke with family member that spoke english and gave the appt date with Dr Magnus Ivan.

## 2012-08-17 ENCOUNTER — Ambulatory Visit (INDEPENDENT_AMBULATORY_CARE_PROVIDER_SITE_OTHER): Payer: PRIVATE HEALTH INSURANCE | Admitting: Surgery

## 2012-08-17 ENCOUNTER — Encounter (INDEPENDENT_AMBULATORY_CARE_PROVIDER_SITE_OTHER): Payer: Self-pay | Admitting: Surgery

## 2012-08-17 VITALS — BP 108/64 | HR 64 | Temp 98.2°F | Resp 16 | Ht 60.5 in | Wt 148.2 lb

## 2012-08-17 DIAGNOSIS — Z09 Encounter for follow-up examination after completed treatment for conditions other than malignant neoplasm: Secondary | ICD-10-CM

## 2012-08-17 NOTE — Progress Notes (Signed)
Subjective:     Patient ID: Courtney Randolph, female   DOB: April 20, 1961, 51 y.o.   MRN: [id number]  HPI She is here for her first postoperative visit status post excision of abdominal wall lipoma in the right side of the abdomen. She also has gallstones but we are unsure whether this is symptomatic. She wanted to have the lipoma removed because of pain. She still has postoperative discomfort so it is hard to determine whether this helped  Review of Systems     Objective:   Physical Exam Her incision is well-healed. The final pathology showed a lipoma.    Assessment:     Patient status post excision of abdominal wall lipoma    Plan:     I will see her back in approximately 3 weeks to see how her symptoms are going and whether or not we need to proceed with a cholecystectomy

## 2012-08-31 ENCOUNTER — Ambulatory Visit (INDEPENDENT_AMBULATORY_CARE_PROVIDER_SITE_OTHER): Payer: Self-pay | Admitting: Family Medicine

## 2012-08-31 ENCOUNTER — Encounter: Payer: Self-pay | Admitting: Family Medicine

## 2012-08-31 VITALS — BP 128/85 | HR 86 | Temp 98.0°F | Wt 147.9 lb

## 2012-08-31 DIAGNOSIS — R300 Dysuria: Secondary | ICD-10-CM

## 2012-08-31 DIAGNOSIS — H04129 Dry eye syndrome of unspecified lacrimal gland: Secondary | ICD-10-CM | POA: Insufficient documentation

## 2012-08-31 LAB — POCT URINALYSIS DIPSTICK
Bilirubin, UA: NEGATIVE
Nitrite, UA: NEGATIVE
Urobilinogen, UA: 0.2
pH, UA: 7

## 2012-08-31 MED ORDER — CEPHALEXIN 500 MG PO CAPS: 500.0000 mg | ORAL_CAPSULE | Freq: Three times a day (TID) | ORAL | Status: DC

## 2012-08-31 NOTE — Assessment & Plan Note (Signed)
With symptoms and trace leuk, will treat with 5 day course of Keflex.  F/u if not improved by Friday.

## 2012-08-31 NOTE — Assessment & Plan Note (Signed)
Will use lubricant eye drop for now.  PCP could determine if patient would benefit from allergy eye drop, although no other true allergy-like symptoms.

## 2012-08-31 NOTE — Progress Notes (Signed)
S: [facility].  ?UTI Patient reports burning with urination x3 days and is only able to urinate a few drops at a time at night.  No increased day time frequency but increased PM frequency x3 days. Some chills, no fevers. Drinking warm water, no other medicines tried.  Denies discharge, no back pain. Has never had symptoms like this before.  Not currently sexually active. + vaginal dryness.   Patient did mention that she had a period recently after not having any for ~2 years.    DRY/BURNING EYES Has been present for a long time, years.  Worse with taking showers and and when she has headaches, but is a problem all of the time.  Does not have any watery eyes or increased tear production.  Does notice some redness, no swelling or redness of her eyelids.  Feels like there is sand in her eyes all of the time.  Has tried Visine eye drops, which helps sometimes, but does not like to use them often.  Occasionally has blurry vision but is unrelated to the burning in her eyes. No light sensitivity. No dry mouth.    ROS: Per HPI  History  Smoking status  . Never Smoker   Smokeless tobacco  . Not on file    O:  Filed Vitals:   08/31/12 1431  BP: 128/85  Pulse: 86  Temp: 98 F (36.7 C)    Gen: NAD HEENT: +mild scleral injection bilaterally, EOMI, PERRLA, no cervical or occipital LAD; MMM Abd: soft, mild suprapubic tenderness, no CVA tenderness   A/P: 51 y.o. female p/w dysuria, dry eye -See problem list -f/u in PRN

## 2012-08-31 NOTE — Patient Instructions (Signed)
It was nice to meet you.  I sent in a 5 day antibiotic course to [facility]. Take it 3 times per day.  Please call if you are not feeling better by Friday.  For your chronic dry eyes, use a lubricant eye drop like Visine every day.    If you have another period, please see Dr. Mikel Cella to discuss further work up.  Come back as needed.

## 2012-09-09 ENCOUNTER — Encounter (INDEPENDENT_AMBULATORY_CARE_PROVIDER_SITE_OTHER): Payer: Self-pay | Admitting: Surgery

## 2012-09-09 ENCOUNTER — Ambulatory Visit (INDEPENDENT_AMBULATORY_CARE_PROVIDER_SITE_OTHER): Payer: PRIVATE HEALTH INSURANCE | Admitting: Surgery

## 2012-09-09 VITALS — BP 102/68 | HR 71 | Temp 97.2°F | Resp 16 | Wt 146.4 lb

## 2012-09-09 DIAGNOSIS — Z09 Encounter for follow-up examination after completed treatment for conditions other than malignant neoplasm: Secondary | ICD-10-CM

## 2012-09-09 NOTE — Progress Notes (Signed)
Subjective:     Patient ID: Courtney Randolph, female   DOB: 1961-03-29, 51 y.o.   MRN: [id number]  HPI She is feeling better. She hasn't was no discomfort and again with questioning has no problems with fatty meals  Review of Systems     Objective:   Physical Exam Her incision is well-healed and her abdomen is soft and nontender    Assessment:     Patient stable postop    Plan:     If she does have any problems with fatty food intolerance, she will call me back and we will again consider cholecystectomy

## 2012-10-01 ENCOUNTER — Telehealth: Payer: Self-pay | Admitting: *Deleted

## 2012-10-01 NOTE — Telephone Encounter (Signed)
Patient needs refill of lisinopril 10 mg tabs.  Last refilled by Belia Heman, CRNP.  Patient uses [facility] on [facility].  Will route request to Dr. Mikel Cella.  Gaylene Brooks, RN

## 2012-10-02 ENCOUNTER — Other Ambulatory Visit: Payer: Self-pay | Admitting: Family Medicine

## 2012-10-02 MED ORDER — LISINOPRIL 10 MG PO TABS: 10.0000 mg | ORAL_TABLET | Freq: Every day | ORAL | Status: DC

## 2012-10-02 NOTE — Telephone Encounter (Signed)
Refill sent. Corlene Sabia M. Cherree Conerly, M.D.  

## 2012-10-13 ENCOUNTER — Encounter (INDEPENDENT_AMBULATORY_CARE_PROVIDER_SITE_OTHER): Payer: Self-pay | Admitting: Surgery

## 2012-10-13 ENCOUNTER — Ambulatory Visit (INDEPENDENT_AMBULATORY_CARE_PROVIDER_SITE_OTHER): Payer: PRIVATE HEALTH INSURANCE | Admitting: Surgery

## 2012-10-13 VITALS — BP 102/64 | HR 72 | Temp 95.3°F | Resp 16 | Ht 60.5 in | Wt 145.0 lb

## 2012-10-13 DIAGNOSIS — K8020 Calculus of gallbladder without cholecystitis without obstruction: Secondary | ICD-10-CM

## 2012-10-13 NOTE — Progress Notes (Signed)
Subjective:     Patient ID: Courtney Randolph, female   DOB: [birth year], 52 y.o.   MRN: [id number]  HPI She does seem to be symptomatic from her  Gallstones and she is interested in scheduling cholecystectomy  Review of Systems     Objective:   Physical Exam On exam, her abdomen today is soft and nontender    Assessment:     Symptomatic cholelithiasis    Plan:     I will schedule her for laparoscopic cholecystectomy.

## 2012-10-15 ENCOUNTER — Encounter (HOSPITAL_COMMUNITY): Payer: Self-pay

## 2012-10-15 ENCOUNTER — Encounter (HOSPITAL_COMMUNITY)
Admission: RE | Admit: 2012-10-15 | Discharge: 2012-10-15 | Disposition: A | Discharge status: Home / Self Care | Payer: PRIVATE HEALTH INSURANCE | Source: Ambulatory Visit | Attending: Surgery | Admitting: Surgery

## 2012-10-15 HISTORY — DX: Gastro-esophageal reflux disease without esophagitis: K21.9

## 2012-10-15 LAB — CBC
HCT: 44.7 % (ref 36.0–46.0)
MCH: 28.9 pg (ref 26.0–34.0)
MCHC: 34.7 g/dL (ref 30.0–36.0)
MCV: 83.4 fL (ref 78.0–100.0)
Platelets: 276 10*3/uL (ref 150–400)
RDW: 13.5 % (ref 11.5–15.5)
WBC: 8.2 10*3/uL (ref 4.0–10.5)

## 2012-10-15 LAB — HCG, SERUM, QUALITATIVE: Preg, Serum: NEGATIVE

## 2012-10-15 LAB — BASIC METABOLIC PANEL
BUN: 9 mg/dL (ref 6–23)
Calcium: 9.4 mg/dL (ref 8.4–10.5)
Chloride: 104 mEq/L (ref 96–112)
Creatinine, Ser: 0.71 mg/dL (ref 0.50–1.10)
GFR calc Af Amer: >90 mL/min (ref >90)
GFR calc non Af Amer: >90 mL/min (ref >90)

## 2012-10-15 MED ORDER — CEFAZOLIN SODIUM-DEXTROSE 2-3 GM-% IV SOLR
2.0000 g | INTRAVENOUS | Status: AC
  Administered 2012-10-16: 2 g via INTRAVENOUS
  Filled 2012-10-15: qty 50

## 2012-10-15 NOTE — H&P (Signed)
Courtney Randolph is an 52 y.o. female.   Chief Complaint: right upper quadrant abdominal pain HPI: this is a pleasant female that presents with RUQ abdominal pain.  She had an abdominal wall lipoma that she thought was the cause of her pain.  Since removal, however, she now has occasional nausea and vomiting after fatty meals.  She has known gallstones.  Past Medical History  Diagnosis Date  . Hypertension   . Postmenopausal   . GERD (gastroesophageal reflux disease)     Past Surgical History  Procedure Date  . Uterine cyst removed     2006  . Mass excision 07/22/2012    lipoma on pathology report from R abdomen    Family History  Problem Relation Age of Onset  . Anesthesia problems Neg Hx    Social History:  reports that she has never smoked. She does not have any smokeless tobacco history on file. She reports that she does not drink alcohol or use illicit drugs.  Allergies: No Known Allergies  No prescriptions prior to admission    Results for orders placed during the hospital encounter of 10/15/12 (from the past 48 hour(s))  SURGICAL PCR SCREEN     Status: Normal   Collection Time   10/15/12 10:32 AM      Component Value Range Comment   MRSA, PCR NEGATIVE  NEGATIVE    Staphylococcus aureus NEGATIVE  NEGATIVE   CBC     Status: Abnormal   Collection Time   10/15/12 10:33 AM      Component Value Range Comment   WBC 8.2  4.0 - 10.5 K/uL    RBC 5.36 (*) 3.87 - 5.11 MIL/uL    Hemoglobin 15.5 (*) 12.0 - 15.0 g/dL    HCT 16.1  09.6 - 04.5 %    MCV 83.4  78.0 - 100.0 fL    MCH 28.9  26.0 - 34.0 pg    MCHC 34.7  30.0 - 36.0 g/dL    RDW 40.9  81.1 - 91.4 %    Platelets 276  150 - 400 K/uL   BASIC METABOLIC PANEL     Status: Normal   Collection Time   10/15/12 10:33 AM      Component Value Range Comment   Sodium 142  135 - 145 mEq/L    Potassium 3.6  3.5 - 5.1 mEq/L    Chloride 104  96 - 112 mEq/L    CO2 29  19 - 32 mEq/L    Glucose, Bld 90  70 - 99 mg/dL    BUN 9  6 - 23  mg/dL    Creatinine, Ser 7.82  0.50 - 1.10 mg/dL    Calcium 9.4  8.4 - 95.6 mg/dL    GFR calc non Af Amer >90  >90 mL/min    GFR calc Af Amer >90  >90 mL/min   HCG, SERUM, QUALITATIVE     Status: Normal   Collection Time   10/15/12 10:33 AM      Component Value Range Comment   Preg, Serum NEGATIVE  NEGATIVE    Dg Chest 2 View  10/15/2012  *RADIOLOGY REPORT*  Clinical Data: Preop  CHEST - 2 VIEW  Comparison: None.  Findings: Cardiomediastinal silhouette is unremarkable.  No acute infiltrate or pleural effusion.  No pulmonary edema.  Bony thorax is unremarkable.  IMPRESSION: No active disease.   Original Report Authenticated By: Natasha Mead, M.D.     Review of Systems  All other systems reviewed  and are negative.    There were no vitals taken for this visit. Physical Exam  Constitutional: She is oriented to person, place, and time. She appears well-developed and well-nourished. No distress.  HENT:  Head: Normocephalic and atraumatic.  Right Ear: External ear normal.  Left Ear: External ear normal.  Nose: Nose normal.  Mouth/Throat: Oropharynx is clear and moist. No oropharyngeal exudate.  Eyes: Conjunctivae normal are normal. Pupils are equal, round, and reactive to light.  Neck: Normal range of motion. Neck supple.  Cardiovascular: Normal rate, regular rhythm, normal heart sounds and intact distal pulses.   No murmur heard. Respiratory: Effort normal and breath sounds normal. No respiratory distress. She has no wheezes.  GI: Soft. Bowel sounds are normal. She exhibits no distension. There is no tenderness.  Musculoskeletal: Normal range of motion. She exhibits no edema.  Neurological: She is alert and oriented to person, place, and time.  Skin: Skin is warm and dry. She is not diaphoretic.  Psychiatric: Her behavior is normal. Judgment normal.     Assessment/Plan Symptomatic cholelithiasis  Lap chole with possible cholangiogram is recommended.  The risks were discussed in  detail and she was given literature in spanish about the surgery.  She agrees to proceed.  Likelihood of success is good.  Shalayah Beagley A 10/15/2012, 8:44 PM

## 2012-10-15 NOTE — Pre-Procedure Instructions (Signed)
Courtney Randolph  10/15/2012   Your procedure is scheduled on:  Friday  10/16/12  Report to [facility] at 530 AM.  Call this number if you have problems the morning of surgery: [phone]   Remember:   Do not eat food or drink liquids after midnight.  Take these medicines the morning of surgery with A SIP OF WATER:  NONE (STOP ASPIRIN, COUMADIN, PLAVIX, EFFIENT, HERBAL MEDICINES)   Do not wear jewelry, make-up or nail polish.  Do not wear lotions, powders, or perfumes. You may wear deodorant.  Do not shave 48 hours prior to surgery. Men may shave face and neck.  Do not bring valuables to the hospital.  Contacts, dentures or bridgework may not be worn into surgery.  Leave suitcase in the car. After surgery it may be brought to your room.  For patients admitted to the hospital, checkout time is 11:00 AM the day of  discharge.   Patients discharged the day of surgery will not be allowed to drive  home.  Name and phone number of your driver:   Special Instructions: Shower using CHG 2 nights before surgery and the night before surgery.  If you shower the day of surgery use CHG.  Use special wash - you have one bottle of CHG for all showers.  You should use approximately 1/3 of the bottle for each shower.   Please read over the following fact sheets that you were given: Pain Booklet, Coughing and Deep Breathing, MRSA Information and Surgical Site Infection Prevention

## 2012-10-16 ENCOUNTER — Ambulatory Visit (HOSPITAL_COMMUNITY)
Admission: RE | Admit: 2012-10-16 | Discharge: 2012-10-16 | Disposition: A | Discharge status: Home / Self Care | Payer: PRIVATE HEALTH INSURANCE | Source: Ambulatory Visit | Attending: Surgery | Admitting: Surgery

## 2012-10-16 ENCOUNTER — Ambulatory Visit (HOSPITAL_COMMUNITY): Payer: PRIVATE HEALTH INSURANCE | Admitting: Anesthesiology

## 2012-10-16 ENCOUNTER — Encounter (HOSPITAL_COMMUNITY): Payer: Self-pay

## 2012-10-16 ENCOUNTER — Encounter (HOSPITAL_COMMUNITY): Payer: Self-pay | Admitting: Anesthesiology

## 2012-10-16 ENCOUNTER — Encounter (HOSPITAL_COMMUNITY)
Admission: RE | Disposition: A | Discharge status: Home / Self Care | Payer: Self-pay | Source: Ambulatory Visit | Attending: Surgery

## 2012-10-16 DIAGNOSIS — K824 Cholesterolosis of gallbladder: Secondary | ICD-10-CM | POA: Insufficient documentation

## 2012-10-16 DIAGNOSIS — Z01818 Encounter for other preprocedural examination: Secondary | ICD-10-CM | POA: Insufficient documentation

## 2012-10-16 DIAGNOSIS — K219 Gastro-esophageal reflux disease without esophagitis: Secondary | ICD-10-CM | POA: Insufficient documentation

## 2012-10-16 DIAGNOSIS — I10 Essential (primary) hypertension: Secondary | ICD-10-CM | POA: Insufficient documentation

## 2012-10-16 DIAGNOSIS — Z01812 Encounter for preprocedural laboratory examination: Secondary | ICD-10-CM | POA: Insufficient documentation

## 2012-10-16 DIAGNOSIS — K811 Chronic cholecystitis: Secondary | ICD-10-CM

## 2012-10-16 HISTORY — PX: CHOLECYSTECTOMY: SHX55

## 2012-10-16 SURGERY — LAPAROSCOPIC CHOLECYSTECTOMY
Anesthesia: General | Site: Abdomen | Wound class: Clean Contaminated

## 2012-10-16 MED ORDER — ACETAMINOPHEN 10 MG/ML IV SOLN: 1000.0000 mg | Freq: Once | INTRAVENOUS | Status: DC

## 2012-10-16 MED ORDER — FENTANYL CITRATE 0.05 MG/ML IJ SOLN
INTRAMUSCULAR | Status: DC | PRN
  Administered 2012-10-16: 150 ug via INTRAVENOUS

## 2012-10-16 MED ORDER — SODIUM CHLORIDE 0.9 % IJ SOLN: 3.0000 mL | Freq: Two times a day (BID) | INTRAMUSCULAR | Status: DC

## 2012-10-16 MED ORDER — MORPHINE SULFATE 4 MG/ML IJ SOLN: 4.0000 mg | INTRAMUSCULAR | Status: DC | PRN

## 2012-10-16 MED ORDER — DEXAMETHASONE SODIUM PHOSPHATE 4 MG/ML IJ SOLN
INTRAMUSCULAR | Status: DC | PRN
  Administered 2012-10-16: 4 mg via INTRAVENOUS

## 2012-10-16 MED ORDER — ACETAMINOPHEN 10 MG/ML IV SOLN
INTRAVENOUS | Status: AC
  Administered 2012-10-16: 1000 mg via INTRAVENOUS
  Filled 2012-10-16: qty 100

## 2012-10-16 MED ORDER — SODIUM CHLORIDE 0.9 % IR SOLN
Status: DC | PRN
  Administered 2012-10-16: 1

## 2012-10-16 MED ORDER — BUPIVACAINE-EPINEPHRINE PF 0.25-1:200000 % IJ SOLN
INTRAMUSCULAR | Status: AC
  Filled 2012-10-16: qty 30

## 2012-10-16 MED ORDER — LIDOCAINE HCL (CARDIAC) 20 MG/ML IV SOLN
INTRAVENOUS | Status: DC | PRN
  Administered 2012-10-16: 70 mg via INTRAVENOUS

## 2012-10-16 MED ORDER — HYDROCODONE-ACETAMINOPHEN 5-325 MG PO TABS: 1.0000 | ORAL_TABLET | ORAL | Status: DC | PRN

## 2012-10-16 MED ORDER — PROPOFOL 10 MG/ML IV BOLUS
INTRAVENOUS | Status: DC | PRN
  Administered 2012-10-16: 120 mg via INTRAVENOUS

## 2012-10-16 MED ORDER — NEOSTIGMINE METHYLSULFATE 1 MG/ML IJ SOLN
INTRAMUSCULAR | Status: DC | PRN
  Administered 2012-10-16: 3 mg via INTRAVENOUS

## 2012-10-16 MED ORDER — SODIUM CHLORIDE 0.9 % IJ SOLN: 3.0000 mL | INTRAMUSCULAR | Status: DC | PRN

## 2012-10-16 MED ORDER — ACETAMINOPHEN 325 MG PO TABS: 650.0000 mg | ORAL_TABLET | ORAL | Status: DC | PRN

## 2012-10-16 MED ORDER — LACTATED RINGERS IV SOLN
INTRAVENOUS | Status: DC | PRN
  Administered 2012-10-16 (×2): via INTRAVENOUS

## 2012-10-16 MED ORDER — HYDROMORPHONE HCL PF 1 MG/ML IJ SOLN
0.2500 mg | INTRAMUSCULAR | Status: DC | PRN
  Administered 2012-10-16 (×4): 0.5 mg via INTRAVENOUS

## 2012-10-16 MED ORDER — ROCURONIUM BROMIDE 100 MG/10ML IV SOLN
INTRAVENOUS | Status: DC | PRN
  Administered 2012-10-16: 30 mg via INTRAVENOUS

## 2012-10-16 MED ORDER — ACETAMINOPHEN 650 MG RE SUPP: 650.0000 mg | RECTAL | Status: DC | PRN

## 2012-10-16 MED ORDER — HYDROMORPHONE HCL PF 1 MG/ML IJ SOLN
INTRAMUSCULAR | Status: AC
  Filled 2012-10-16: qty 2

## 2012-10-16 MED ORDER — PHENYLEPHRINE HCL 10 MG/ML IJ SOLN
INTRAMUSCULAR | Status: DC | PRN
  Administered 2012-10-16: 40 ug via INTRAVENOUS
  Administered 2012-10-16: 80 ug via INTRAVENOUS

## 2012-10-16 MED ORDER — MIDAZOLAM HCL 5 MG/5ML IJ SOLN
INTRAMUSCULAR | Status: DC | PRN
  Administered 2012-10-16: 2 mg via INTRAVENOUS

## 2012-10-16 MED ORDER — ONDANSETRON HCL 4 MG/2ML IJ SOLN: 4.0000 mg | Freq: Four times a day (QID) | INTRAMUSCULAR | Status: DC | PRN

## 2012-10-16 MED ORDER — ONDANSETRON HCL 4 MG/2ML IJ SOLN
INTRAMUSCULAR | Status: DC | PRN
  Administered 2012-10-16: 4 mg via INTRAVENOUS

## 2012-10-16 MED ORDER — OXYCODONE HCL 5 MG/5ML PO SOLN: 5.0000 mg | Freq: Once | ORAL | Status: DC | PRN

## 2012-10-16 MED ORDER — SODIUM CHLORIDE 0.9 % IV SOLN: 250.0000 mL | INTRAVENOUS | Status: DC | PRN

## 2012-10-16 MED ORDER — OXYCODONE HCL 5 MG PO TABS: 5.0000 mg | ORAL_TABLET | Freq: Once | ORAL | Status: DC | PRN

## 2012-10-16 MED ORDER — GLYCOPYRROLATE 0.2 MG/ML IJ SOLN
INTRAMUSCULAR | Status: DC | PRN
  Administered 2012-10-16: .4 mg via INTRAVENOUS

## 2012-10-16 MED ORDER — BUPIVACAINE-EPINEPHRINE 0.25% -1:200000 IJ SOLN
INTRAMUSCULAR | Status: DC | PRN
  Administered 2012-10-16: 30 mL

## 2012-10-16 MED ORDER — OXYCODONE HCL 5 MG PO TABS: 5.0000 mg | ORAL_TABLET | ORAL | Status: DC | PRN

## 2012-10-16 SURGICAL SUPPLY — 38 items
APPLIER CLIP 5 13 M/L LIGAMAX5 (MISCELLANEOUS) ×3
BANDAGE ADHESIVE 1X3 (GAUZE/BANDAGES/DRESSINGS) ×3 IMPLANT
BENZOIN TINCTURE PRP APPL 2/3 (GAUZE/BANDAGES/DRESSINGS) IMPLANT
CANISTER SUCTION 2500CC (MISCELLANEOUS) ×3 IMPLANT
CHLORAPREP W/TINT 26ML (MISCELLANEOUS) ×3 IMPLANT
CLIP APPLIE 5 13 M/L LIGAMAX5 (MISCELLANEOUS) ×2 IMPLANT
CLOTH BEACON ORANGE TIMEOUT ST (SAFETY) ×3 IMPLANT
COVER MAYO STAND STRL (DRAPES) IMPLANT
COVER SURGICAL LIGHT HANDLE (MISCELLANEOUS) ×3 IMPLANT
DECANTER SPIKE VIAL GLASS SM (MISCELLANEOUS) IMPLANT
DRAPE C-ARM 42X72 X-RAY (DRAPES) IMPLANT
ELECT REM PT RETURN 9FT ADLT (ELECTROSURGICAL) ×3
ELECTRODE REM PT RTRN 9FT ADLT (ELECTROSURGICAL) ×2 IMPLANT
GLOVE BIO SURGEON STRL SZ7.5 (GLOVE) ×3 IMPLANT
GLOVE BIOGEL PI IND STRL 7.5 (GLOVE) ×2 IMPLANT
GLOVE BIOGEL PI INDICATOR 7.5 (GLOVE) ×1
GLOVE ECLIPSE 6.5 STRL STRAW (GLOVE) ×6 IMPLANT
GLOVE SURG SIGNA 7.5 PF LTX (GLOVE) ×3 IMPLANT
GOWN PREVENTION PLUS XLARGE (GOWN DISPOSABLE) ×3 IMPLANT
GOWN STRL NON-REIN LRG LVL3 (GOWN DISPOSABLE) ×9 IMPLANT
KIT BASIN OR (CUSTOM PROCEDURE TRAY) ×3 IMPLANT
KIT ROOM TURNOVER OR (KITS) ×3 IMPLANT
MARKER SKIN DUAL TIP RULER LAB (MISCELLANEOUS) ×3 IMPLANT
NS IRRIG 1000ML POUR BTL (IV SOLUTION) ×3 IMPLANT
PAD ARMBOARD 7.5X6 YLW CONV (MISCELLANEOUS) ×6 IMPLANT
POUCH SPECIMEN RETRIEVAL 10MM (ENDOMECHANICALS) IMPLANT
SCISSORS LAP 5X35 DISP (ENDOMECHANICALS) IMPLANT
SET CHOLANGIOGRAPH 5 50 .035 (SET/KITS/TRAYS/PACK) IMPLANT
SET IRRIG TUBING LAPAROSCOPIC (IRRIGATION / IRRIGATOR) ×3 IMPLANT
SLEEVE ENDOPATH XCEL 5M (ENDOMECHANICALS) ×6 IMPLANT
SPECIMEN JAR SMALL (MISCELLANEOUS) ×3 IMPLANT
SUT MON AB 4-0 PC3 18 (SUTURE) ×3 IMPLANT
TOWEL OR 17X24 6PK STRL BLUE (TOWEL DISPOSABLE) ×3 IMPLANT
TOWEL OR 17X26 10 PK STRL BLUE (TOWEL DISPOSABLE) ×3 IMPLANT
TRAY LAPAROSCOPIC (CUSTOM PROCEDURE TRAY) ×3 IMPLANT
TROCAR XCEL BLUNT TIP 100MML (ENDOMECHANICALS) ×3 IMPLANT
TROCAR XCEL NON-BLD 5MMX100MML (ENDOMECHANICALS) ×3 IMPLANT
WATER STERILE IRR 1000ML POUR (IV SOLUTION) IMPLANT

## 2012-10-16 NOTE — Op Note (Signed)
Laparoscopic Cholecystectomy Procedure Note  Indications: This patient presents with symptomatic gallbladder disease and will undergo laparoscopic cholecystectomy.  Pre-operative Diagnosis: Calculus of gallbladder without mention of cholecystitis or obstruction  Post-operative Diagnosis: Same  Surgeon: Abigail Miyamoto A   Assistants: 0  Anesthesia: General endotracheal anesthesia  ASA Class: 1  Procedure Details  The patient was seen again in the Holding Room. The risks, benefits, complications, treatment options, and expected outcomes were discussed with the patient. The possibilities of reaction to medication, pulmonary aspiration, perforation of viscus, bleeding, recurrent infection, finding a normal gallbladder, the need for additional procedures, failure to diagnose a condition, the possible need to convert to an open procedure, and creating a complication requiring transfusion or operation were discussed with the patient. The likelihood of improving the patient's symptoms with return to their baseline status is good.  The patient and/or family concurred with the proposed plan, giving informed consent. The site of surgery properly noted. The patient was taken to Operating Room, identified as Courtney Randolph and the procedure verified as Laparoscopic Cholecystectomy with Intraoperative Cholangiogram. A Time Out was held and the above information confirmed.  Prior to the induction of general anesthesia, antibiotic prophylaxis was administered. General endotracheal anesthesia was then administered and tolerated well. After the induction, the abdomen was prepped with Chloraprep and draped in sterile fashion. The patient was positioned in the supine position.  Local anesthetic agent was injected into the skin near the umbilicus and an incision made. We dissected down to the abdominal fascia with blunt dissection.  The fascia was incised vertically and we entered the peritoneal cavity bluntly.  A  pursestring suture of 0-Vicryl was placed around the fascial opening.  The Hasson cannula was inserted and secured with the stay suture.  Pneumoperitoneum was then created with CO2 and tolerated well without any adverse changes in the patient's vital signs. An 11-mm port was placed in the subxiphoid position.  Two 5-mm ports were placed in the right upper quadrant. All skin incisions were infiltrated with a local anesthetic agent before making the incision and placing the trocars.   We positioned the patient in reverse Trendelenburg, tilted slightly to the patient's left.  The gallbladder was identified, the fundus grasped and retracted cephalad. Adhesions were lysed bluntly and with the electrocautery where indicated, taking care not to injure any adjacent organs or viscus. The infundibulum was grasped and retracted laterally, exposing the peritoneum overlying the triangle of Calot. This was then divided and exposed in a blunt fashion. The cystic duct was clearly identified and bluntly dissected circumferentially. A critical view of the cystic duct and cystic artery was obtained.  The cystic duct was then ligated with clips and divided. The cystic artery was, dissected free, ligated with clips and divided as well.   The gallbladder was dissected from the liver bed in retrograde fashion with the electrocautery. The gallbladder was removed and placed in an Endocatch sac. The liver bed was irrigated and inspected. Hemostasis was achieved with the electrocautery. Copious irrigation was utilized and was repeatedly aspirated until clear.  The gallbladder and Endocatch sac were then removed through the umbilical port site.  The pursestring suture was used to close the umbilical fascia.    We again inspected the right upper quadrant for hemostasis.  Pneumoperitoneum was released as we removed the trocars.  4-0 Monocryl was used to close the skin.   Benzoin, steri-strips, and clean dressings were applied. The patient  was then extubated and brought to the recovery room in  stable condition. Instrument, sponge, and needle counts were correct at closure and at the conclusion of the case.   Findings: Cholecystitis with Cholelithiasis  Estimated Blood Loss: Minimal         Drains: 0         Specimens: Gallbladder           Complications: None; patient tolerated the procedure well.         Disposition: PACU - hemodynamically stable.         Condition: stable

## 2012-10-16 NOTE — Anesthesia Postprocedure Evaluation (Signed)
  Anesthesia Post-op Note  Patient: Courtney Randolph  Procedure(s) Performed: Procedure(s) (LRB) with comments: LAPAROSCOPIC CHOLECYSTECTOMY (N/A)  Patient Location: PACU  Anesthesia Type:General  Level of Consciousness: awake  Airway and Oxygen Therapy: Patient Spontanous Breathing  Post-op Pain: mild  Post-op Assessment: Post-op Vital signs reviewed  Post-op Vital Signs: stable  Complications: No apparent anesthesia complications

## 2012-10-16 NOTE — Progress Notes (Signed)
Language line used for spanish interpreter Brother-in-law also with patient. Both deny any questions, and voice understanding.

## 2012-10-16 NOTE — Anesthesia Preprocedure Evaluation (Signed)
Anesthesia Evaluation  Patient identified by MRN, date of birth, ID band Patient awake    Reviewed: Allergy & Precautions, H&P , NPO status , Patient's Chart, lab work & pertinent test results  History of Anesthesia Complications Negative for: history of anesthetic complications  Airway Mallampati: I TM Distance: >3 FB Neck ROM: Full    Dental  (+) Edentulous Upper and Edentulous Lower   Pulmonary neg pulmonary ROS,  breath sounds clear to auscultation  Pulmonary exam normal       Cardiovascular hypertension, Pt. on medications Rhythm:Regular Rate:Normal     Neuro/Psych negative neurological ROS     GI/Hepatic Neg liver ROS, GERD-  Poorly Controlled,  Endo/Other  negative endocrine ROS  Renal/GU negative Renal ROS     Musculoskeletal   Abdominal   Peds  Hematology   Anesthesia Other Findings   Reproductive/Obstetrics                           Anesthesia Physical Anesthesia Plan  ASA: II  Anesthesia Plan: General   Post-op Pain Management:    Induction: Intravenous  Airway Management Planned: Oral ETT  Additional Equipment:   Intra-op Plan:   Post-operative Plan: Extubation in OR  Informed Consent: I have reviewed the patients History and Physical, chart, labs and discussed the procedure including the risks, benefits and alternatives for the proposed anesthesia with the patient or authorized representative who has indicated his/her understanding and acceptance.     Plan Discussed with: CRNA and Surgeon  Anesthesia Plan Comments: (Plan routine monitors, GETA )        Anesthesia Quick Evaluation

## 2012-10-16 NOTE — Progress Notes (Signed)
Patient's brother- in-law left without her dentures. Dentures taken to post op and section c secretary Tabbitha notified so when patient returns to section c after surgery they will know.

## 2012-10-16 NOTE — Transfer of Care (Signed)
Immediate Anesthesia Transfer of Care Note  Patient: Courtney Randolph  Procedure(s) Performed: Procedure(s) (LRB) with comments: LAPAROSCOPIC CHOLECYSTECTOMY (N/A)  Patient Location: PACU  Anesthesia Type:General  Level of Consciousness: awake, alert  and oriented  Airway & Oxygen Therapy: Patient Spontanous Breathing and Patient connected to nasal cannula oxygen  Post-op Assessment: Report given to PACU RN, Post -op Vital signs reviewed and stable and Patient moving all extremities X 4  Post vital signs: Reviewed and stable  Complications: No apparent anesthesia complications

## 2012-10-16 NOTE — Interval H&P Note (Signed)
History and Physical Interval Note: no change in H and P  10/16/2012 8:50 AM  Courtney Randolph  has presented today for surgery, with the diagnosis of gallstones  The various methods of treatment have been discussed with the patient and family. After consideration of risks, benefits and other options for treatment, the patient has consented to  Procedure(s) (LRB) with comments: LAPAROSCOPIC CHOLECYSTECTOMY (N/A) INTRAOPERATIVE CHOLANGIOGRAM (N/A) as a surgical intervention .  The patient's history has been reviewed, patient examined, no change in status, stable for surgery.  I have reviewed the patient's chart and labs.  Questions were answered to the patient's satisfaction.     Angeles Zehner A

## 2012-10-16 NOTE — Preoperative (Signed)
Beta Blockers   Reason not to administer Beta Blockers:Not Applicable 

## 2012-10-19 ENCOUNTER — Encounter (HOSPITAL_COMMUNITY): Payer: Self-pay | Admitting: Surgery

## 2012-11-03 ENCOUNTER — Encounter (INDEPENDENT_AMBULATORY_CARE_PROVIDER_SITE_OTHER): Payer: Self-pay | Admitting: Surgery

## 2012-11-03 ENCOUNTER — Ambulatory Visit (INDEPENDENT_AMBULATORY_CARE_PROVIDER_SITE_OTHER): Payer: PRIVATE HEALTH INSURANCE | Admitting: Surgery

## 2012-11-03 VITALS — BP 103/64 | HR 65 | Temp 98.7°F | Resp 12 | Ht 60.0 in | Wt 143.8 lb

## 2012-11-03 DIAGNOSIS — Z09 Encounter for follow-up examination after completed treatment for conditions other than malignant neoplasm: Secondary | ICD-10-CM

## 2012-11-03 NOTE — Progress Notes (Signed)
Subjective:     Patient ID: Courtney Randolph, female   DOB: 1961-09-25, 52 y.o.   MRN: [id number]  HPI She is here for first postop visit. She is doing well and has no complaints. She is eating well and moving her bowels well  Review of Systems     Objective:   Physical Exam On exam, her incisions are well-healed. The final pathology showed mild chronic cholecystitis    Assessment:     Patient stable postop    Plan:     She will return to work tomorrow to [employer] duty. She will refrain from heavy lifting until February 18. I will see her back as needed

## 2012-12-14 ENCOUNTER — Other Ambulatory Visit (HOSPITAL_COMMUNITY): Payer: Self-pay | Admitting: Geriatric Medicine

## 2012-12-20 IMAGING — US US ABDOMEN COMPLETE
1 series · 14 of 25 positions shown · non-contrast
Comparison: None.

CLINICAL DATA: Right abdominal pain and possible mass.

ABDOMINAL ULTRASOUND COMPLETE

[Series 1: us abdomen complete · 14 of 107 slices shown]
[im 1/107]
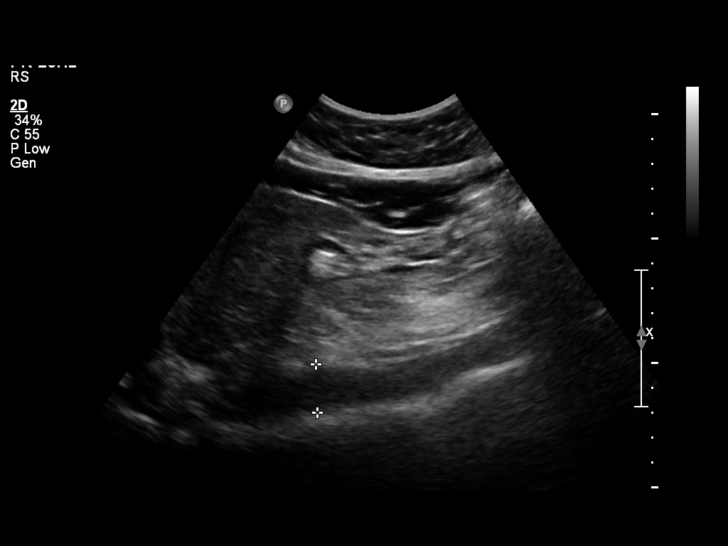
[im 9/107]
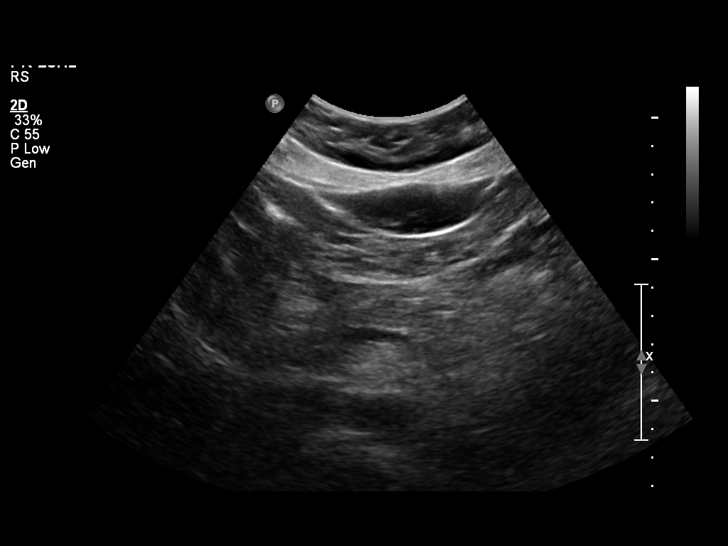
[im 18/107]
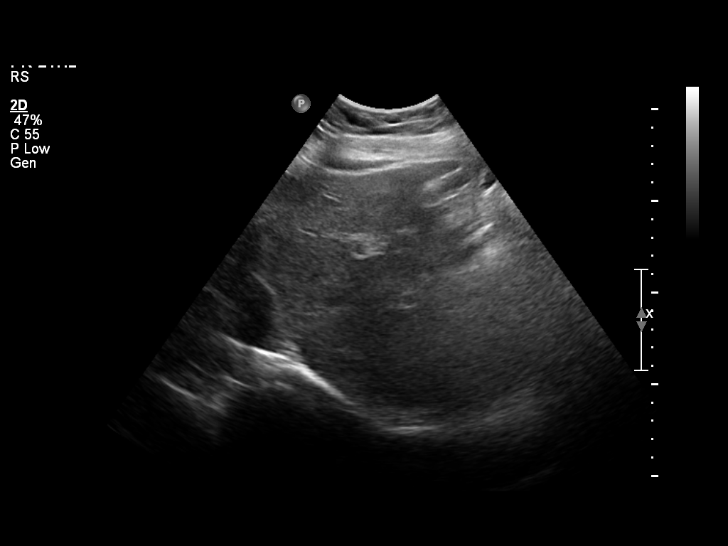
[im 27/107]
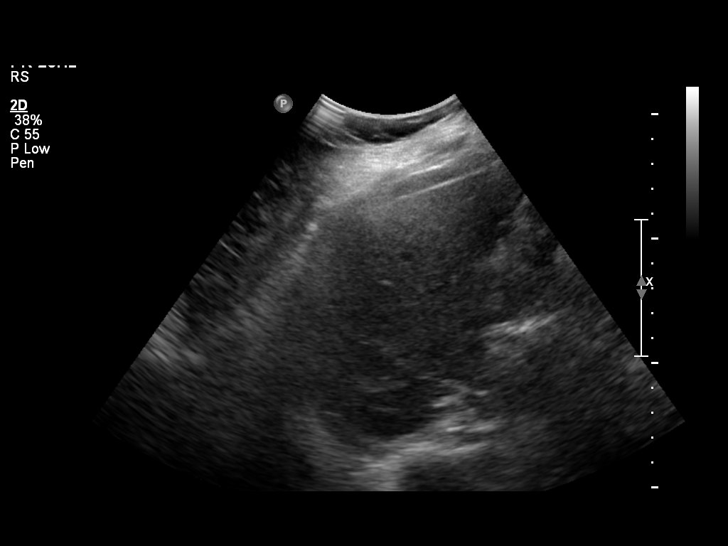
[im 36/107]
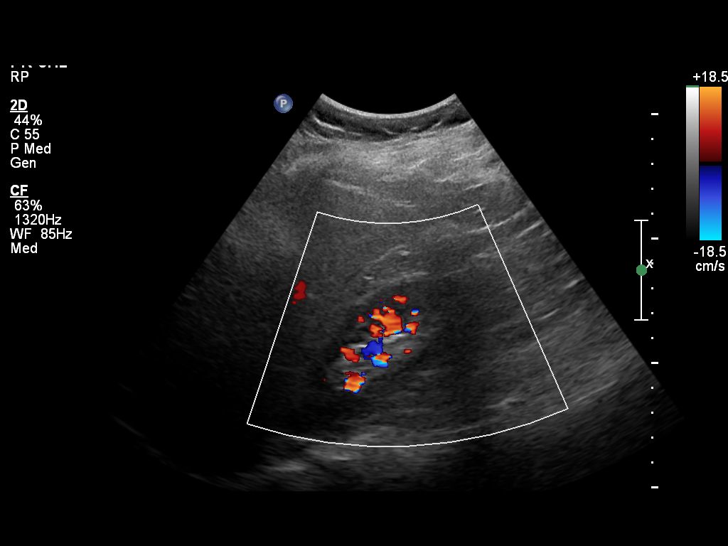
[im 40/107]
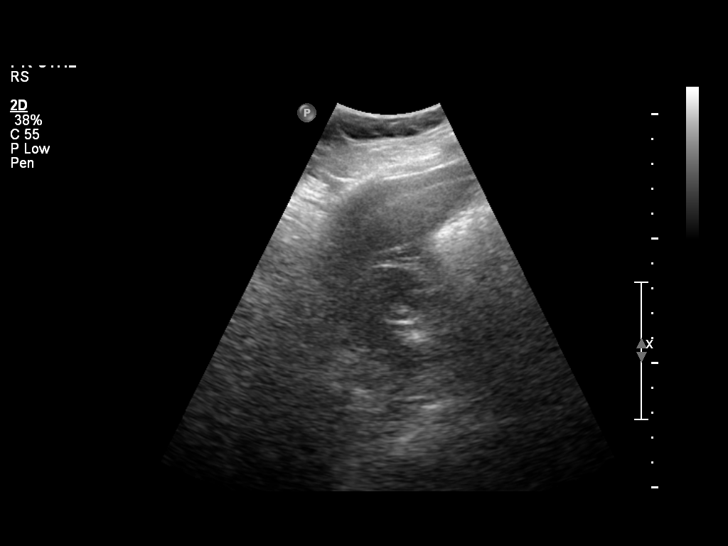
[im 49/107]
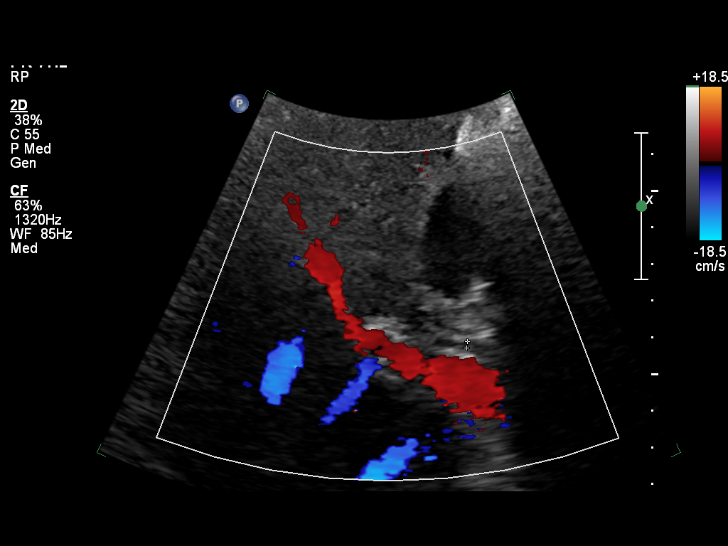
[im 58/107]
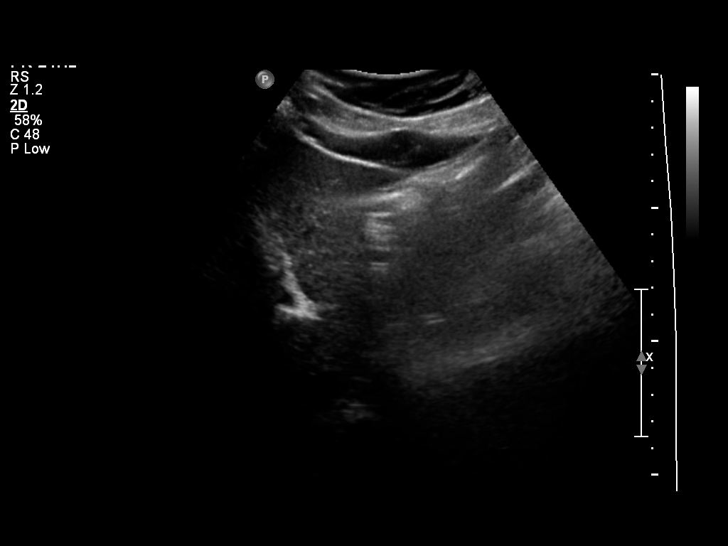
[im 67/107]
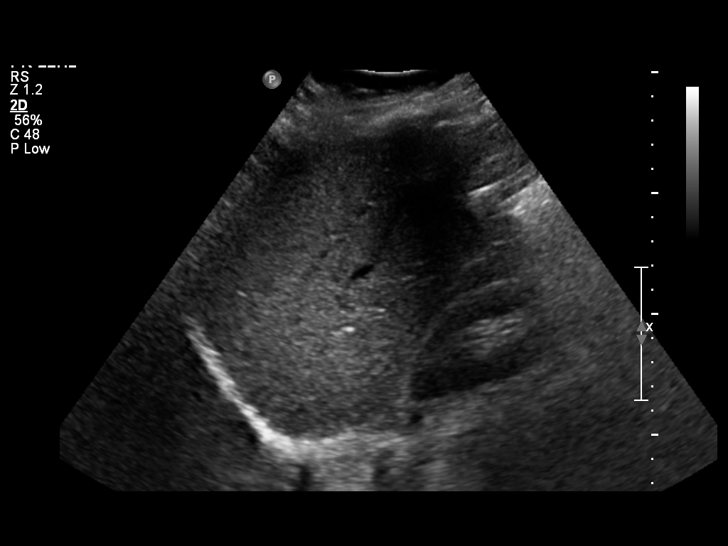
[im 71/107]
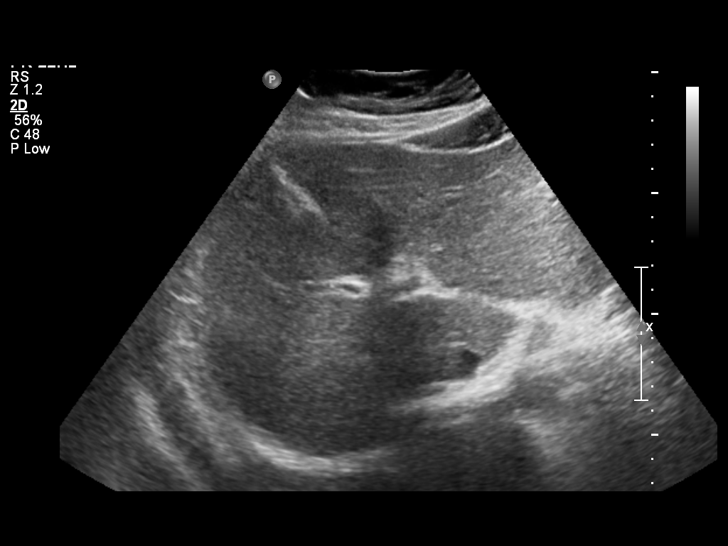
[im 80/107]
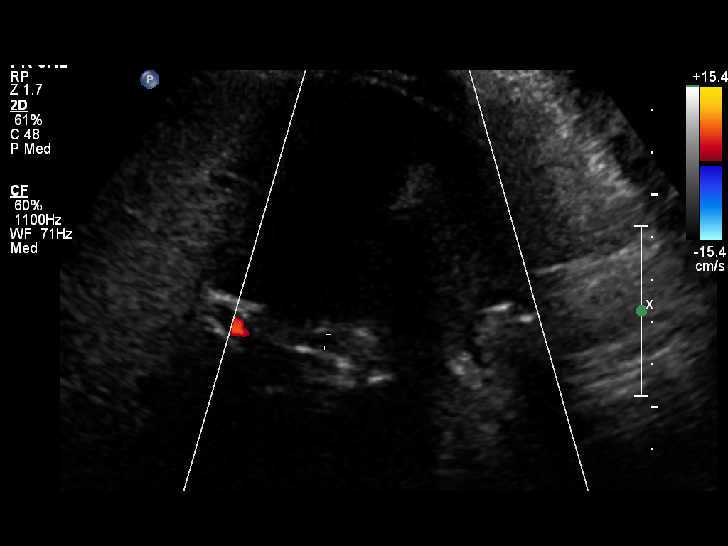
[im 89/107]
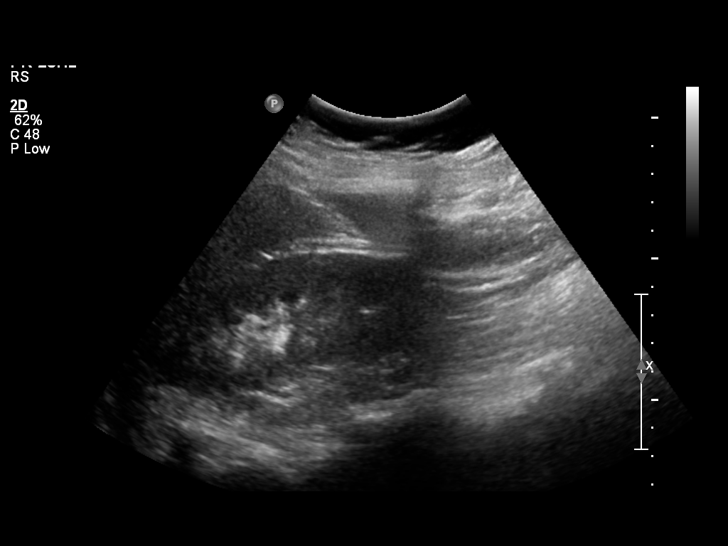
[im 98/107]
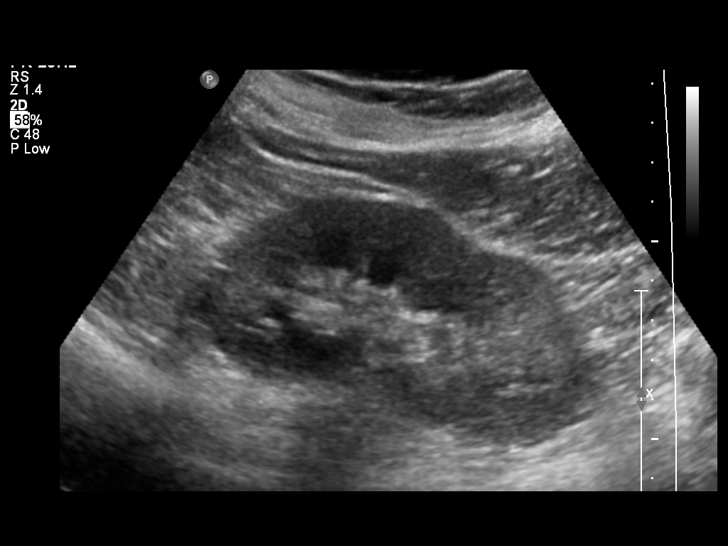
[im 107/107]
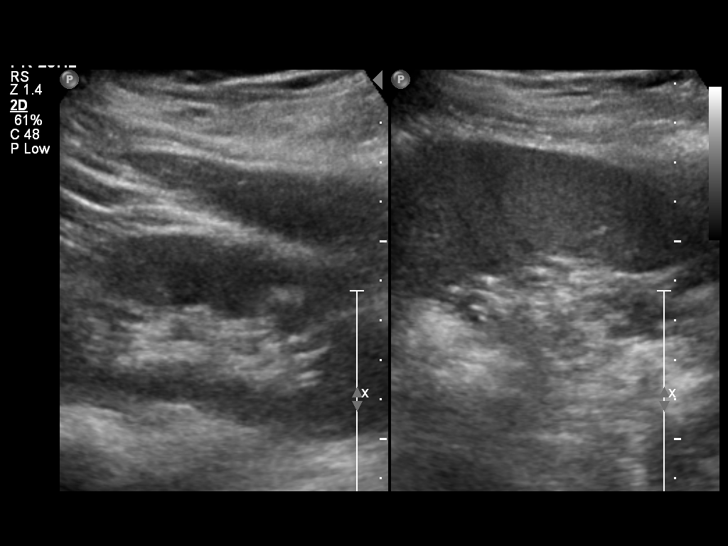

[14 of 25 positions shown; findings below may reference images not displayed]

FINDINGS: Gallbladder:  A 4 mm echogenic focus is seen which is no longer
demonstrated when the patient changes positions, likely
representing a tiny mobile gallstone.  No evidence of gallbladder
wall thickening or pericholecystic fluid.

Common Bile Duct:  Within normal limits in caliber. Measures 3 mm
in diameter.

Liver: No focal mass lesion identified.  Within normal limits in
parenchymal echogenicity.

IVC:  Appears normal.

Pancreas:  No abnormality identified.

Spleen:  Within normal limits in size and echotexture.

Right kidney:  Normal in size and parenchymal echogenicity.  No
evidence of mass or hydronephrosis.

Left kidney:  Normal in size and parenchymal echogenicity.  No
evidence of mass or hydronephrosis.

Abdominal Aorta:  No aneurysm identified.
IMPRESSION: Probable single tiny gallstone.  No sonographic signs of acute
cholecystitis, biliary dilatation, or other significant
abnormality.

## 2014-04-13 ENCOUNTER — Other Ambulatory Visit (HOSPITAL_COMMUNITY): Payer: Self-pay | Admitting: *Deleted

## 2014-04-13 DIAGNOSIS — Z803 Family history of malignant neoplasm of breast: Secondary | ICD-10-CM

## 2014-04-13 DIAGNOSIS — N644 Mastodynia: Secondary | ICD-10-CM

## 2014-04-26 ENCOUNTER — Ambulatory Visit (HOSPITAL_COMMUNITY)
Admission: RE | Admit: 2014-04-26 | Discharge: 2014-04-26 | Disposition: A | Discharge status: Home / Self Care | Payer: Self-pay | Source: Ambulatory Visit | Attending: Obstetrics and Gynecology | Admitting: Obstetrics and Gynecology

## 2014-04-26 ENCOUNTER — Encounter (HOSPITAL_COMMUNITY): Payer: Self-pay

## 2014-04-26 VITALS — BP 118/62 | Temp 97.9°F | Ht 61.0 in | Wt 141.2 lb

## 2014-04-26 DIAGNOSIS — N6311 Unspecified lump in the right breast, upper outer quadrant: Secondary | ICD-10-CM | POA: Insufficient documentation

## 2014-04-26 DIAGNOSIS — Z1239 Encounter for other screening for malignant neoplasm of breast: Secondary | ICD-10-CM

## 2014-04-26 NOTE — Patient Instructions (Signed)
Explained to  Courtney Randolph that she did not need a Pap smear today due to last Pap smear was in February 2014 per patient. Let her know BCCCP will cover Pap smears every 3 years unless has a history of abnormal Pap smears. Referred patient to the [facility] for bilateral diagnostic mammogram and right breast ultrasound. Appointment scheduled for Thursday, April 28, 2014 at 1015. Patient aware of appointment and will be there. Courtney Randolph verbalized understanding.  Joanne Salah, Arvil Chaco, RN 12:19 PM

## 2014-04-26 NOTE — Progress Notes (Signed)
Complaints of right breast pain x 3 months that comes and goes. Patient rated pain at a 5 out of 10.  Pap Smear:   Pap smear not completed today. Last Pap smear was in February 2014 at the free cervical cancer screening at the [facility] and normal per patient. Per patient has no history of an abnormal Pap smear. No Pap smear results in EPIC.  Physical exam: Breasts Breasts symmetrical. No skin abnormalities bilateral breasts. No nipple retraction bilateral breasts. No nipple discharge bilateral breasts. No lymphadenopathy. No lumps palpated left breast breast. Palpated a small pea sized lump within the right breast at 10 o'clock 9 cm from the nipple. Complaints of tenderness when palpated right outer breast.Referred patient to the [facility] for bilateral diagnostic mammogram and right breast ultrasound. Appointment scheduled for Thursday, April 28, 2014 at 1015.     Pelvic/Bimanual No Pap smear completed today since last Pap smear was February 2014 per patient. Pap smear not indicated per BCCCP guidelines.

## 2014-04-28 ENCOUNTER — Encounter (INDEPENDENT_AMBULATORY_CARE_PROVIDER_SITE_OTHER): Payer: Self-pay

## 2014-04-28 ENCOUNTER — Ambulatory Visit
Admission: RE | Admit: 2014-04-28 | Discharge: 2014-04-28 | Disposition: A | Discharge status: Home / Self Care | Payer: No Typology Code available for payment source | Source: Ambulatory Visit | Attending: Obstetrics and Gynecology | Admitting: Obstetrics and Gynecology

## 2014-04-28 ENCOUNTER — Other Ambulatory Visit (HOSPITAL_COMMUNITY): Payer: Self-pay | Admitting: Obstetrics and Gynecology

## 2014-04-28 DIAGNOSIS — Z803 Family history of malignant neoplasm of breast: Secondary | ICD-10-CM

## 2014-04-28 DIAGNOSIS — N6310 Unspecified lump in the right breast, unspecified quadrant: Secondary | ICD-10-CM

## 2014-04-28 DIAGNOSIS — N644 Mastodynia: Secondary | ICD-10-CM

## 2014-06-15 ENCOUNTER — Other Ambulatory Visit: Payer: Self-pay | Admitting: *Deleted

## 2014-06-21 ENCOUNTER — Other Ambulatory Visit: Payer: Self-pay | Admitting: *Deleted

## 2014-06-22 NOTE — Telephone Encounter (Signed)
Left voice message for pt stating that she will need an appt before any medication will be refilled.  Pt has not been seen since 2013.  Derl Barrow, RN

## 2014-06-22 NOTE — Telephone Encounter (Signed)
Please call patient and let her know I won't refill her medications until she has an appointment. She hasn't been seen here since 2013.   Thanks  [employer]

## 2014-06-24 ENCOUNTER — Other Ambulatory Visit: Payer: Self-pay | Admitting: *Deleted

## 2014-08-01 ENCOUNTER — Encounter (HOSPITAL_COMMUNITY): Payer: Self-pay

## 2015-08-09 DIAGNOSIS — E109 Type 1 diabetes mellitus without complications: Secondary | ICD-10-CM

## 2015-08-09 NOTE — Congregational Nurse Program (Signed)
Congregational Nurse Program Note  Date of Encounter: 08/09/2015  Past Medical History: Past Medical History  Diagnosis Date  . Hypertension   . Postmenopausal   . GERD (gastroesophageal reflux disease)     Encounter Details:     CNP Questionnaire - 08/08/15 0740    Patient Demographics   Is this a new or existing patient? New   Patient is considered a/an Immigrant   Patient Assistance   Patient's financial/insurance status Uninsured   Patient referred to apply for the following financial assistance [place] insecurities addressed Not Applicable   Transportation assistance No   Assistance securing medications No   Educational health offerings Health literacy;Navigating the healthcare system   Encounter Details   Primary purpose of visit Other (comment)  came to have her blood sugar checked.     Was an Emergency Department visit averted? Not Applicable   Does patient have a medical provider? No  has been a pt. at [facility] but orange card expired and  was told she has to be a [place] Citizen to renew.     Patient referred to [facility]Clinic   Was a mental health screening completed? (GAINS tool) No   Does patient have dental issues? No   Since previous encounter, have you referred patient for abnormal blood pressure that resulted in a new diagnosis or medication change? No   Since previous encounter, have you referred patient for abnormal blood glucose that resulted in a new diagnosis or medication change? No      Vs.  121/74  Blood glucose 108 at 4.5 hrs. After eating  Take no meds.  Pharmacy coupon given for medication discounts should she have need.  Referred to [facility] to renew orange card and re establish with PCP.

## 2017-10-08 ENCOUNTER — Encounter (HOSPITAL_COMMUNITY): Payer: Self-pay

## 2019-10-01 DIAGNOSIS — R7303 Prediabetes: Secondary | ICD-10-CM

## 2019-10-01 HISTORY — DX: Prediabetes: R73.03

## 2019-10-22 ENCOUNTER — Other Ambulatory Visit (HOSPITAL_COMMUNITY): Payer: Self-pay | Admitting: *Deleted

## 2019-10-22 DIAGNOSIS — Z1231 Encounter for screening mammogram for malignant neoplasm of breast: Secondary | ICD-10-CM

## 2019-11-30 ENCOUNTER — Ambulatory Visit: Payer: Self-pay | Admitting: Advanced Practice Midwife

## 2019-11-30 ENCOUNTER — Ambulatory Visit
Admission: RE | Admit: 2019-11-30 | Discharge: 2019-11-30 | Disposition: A | Discharge status: Home / Self Care | Payer: No Typology Code available for payment source | Source: Ambulatory Visit | Attending: Obstetrics and Gynecology | Admitting: Obstetrics and Gynecology

## 2019-11-30 ENCOUNTER — Other Ambulatory Visit: Payer: Self-pay

## 2019-11-30 VITALS — BP 149/89 | Temp 97.7°F | Wt 138.0 lb

## 2019-11-30 DIAGNOSIS — Z124 Encounter for screening for malignant neoplasm of cervix: Secondary | ICD-10-CM

## 2019-11-30 DIAGNOSIS — Z1231 Encounter for screening mammogram for malignant neoplasm of breast: Secondary | ICD-10-CM

## 2019-11-30 DIAGNOSIS — Z1239 Encounter for other screening for malignant neoplasm of breast: Secondary | ICD-10-CM

## 2019-11-30 NOTE — Progress Notes (Signed)
Ms. Courtney Randolph is a 60 y.o. [phone] female who presents to [facility] clinic today with complaint of breast pain x 8 days. She thinks this may be from lifting at her job.    Pap Smear: Pap smear completed today. Last Pap smear was 10/2012 at [facility] free pap event clinic and was normal. Per patient has no history of an abnormal Pap smear. Last Pap smear result is not available in Epic.   Physical exam: Breasts Breasts symmetrical. No skin abnormalities bilateral breasts. No nipple retraction bilateral breasts. No nipple discharge bilateral breasts. No lymphadenopathy. No lumps palpated bilateral breasts.       Pelvic/Bimanual Ext Genitalia No lesions, no swelling and no discharge observed on external genitalia.        Vagina Vagina pink and normal texture. No lesions or discharge observed in vagina.        Cervix Cervix is present. Cervix pink and of normal texture. No discharge observed.    Uterus Uterus is present and palpable. Uterus in normal position and normal size.        Adnexae Bilateral ovaries present and palpable. No tenderness on palpation.         Rectovaginal No rectal exam completed today since patient had no rectal complaints. No skin abnormalities observed on exam.     Smoking History: Patient has never smoked    Patient Navigation: Patient education provided. Access to services provided for patient through [facility] program. Spanish interpreter provided.   Colorectal Cancer Screening: Per patient has never had colonoscopy completed No complaints today.    Breast and Cervical Cancer Risk Assessment: Patient does not have family history of breast cancer, known genetic mutations, or radiation treatment to the chest before age 65. Patient does not have history of cervical dysplasia, immunocompromised, or DES exposure in-utero.  Risk Assessment    Risk Scores      11/30/2019   Last edited by: Demetrius Revel, LPN   5-year risk: 0.6 %   Lifetime risk: 3.5 %           A: BCCCP exam with pap smear   P: Referred patient to the [facility] for a screening mammogram. Appointment scheduled 3/2/201 at 1540.  Marcille Buffy DNP, CNM  11/30/19  12:51 PM

## 2019-12-01 LAB — CYTOLOGY - PAP
Comment: NEGATIVE
Diagnosis: NEGATIVE
High risk HPV: NEGATIVE

## 2019-12-08 ENCOUNTER — Telehealth: Payer: Self-pay

## 2019-12-08 NOTE — Telephone Encounter (Signed)
Via Almyra Free, [place] Interpreter, pt informed Pap/HPV negative.

## 2020-02-24 ENCOUNTER — Other Ambulatory Visit: Payer: Self-pay

## 2020-02-24 ENCOUNTER — Encounter: Payer: Self-pay | Admitting: Internal Medicine

## 2020-02-24 ENCOUNTER — Ambulatory Visit: Payer: Self-pay | Admitting: Internal Medicine

## 2020-02-24 VITALS — BP 134/82 | HR 72 | Resp 12 | Ht 59.0 in | Wt 137.0 lb

## 2020-02-24 DIAGNOSIS — I10 Essential (primary) hypertension: Secondary | ICD-10-CM

## 2020-02-24 DIAGNOSIS — M7041 Prepatellar bursitis, right knee: Secondary | ICD-10-CM

## 2020-02-24 DIAGNOSIS — Z13220 Encounter for screening for lipoid disorders: Secondary | ICD-10-CM

## 2020-02-24 DIAGNOSIS — M1990 Unspecified osteoarthritis, unspecified site: Secondary | ICD-10-CM

## 2020-02-24 MED ORDER — LISINOPRIL 10 MG PO TABS: 10.0000 mg | ORAL_TABLET | Freq: Every day | ORAL | 3 refills | Status: DC

## 2020-02-24 NOTE — Progress Notes (Signed)
Social worker met with new patient who is scheduled with Dr. Amil Amen for medical visit. Social worker completed New Patient Questionnaire which included completion of housing, intimate partner violence, transportation needs, stress, Emergency planning/management officer strain, food insecurity and screeners. Social History   Socioeconomic History  . Marital status: Single    Spouse name: Not on file  . Number of children: 4  . Years of education: Not on file  . Highest education level: 1st grade  Occupational History  . 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 Smoking status: Never Smoker  . Smokeless tobacco: Never Used  Substance and Sexual Activity  . Alcohol use: No  . Drug use: No  . Sexual activity: Not Currently  Other Topics Concern  . Not on file  Social History Narrative   Lives alone in [place]. Has good support network in town including family members. 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   Originally from [place]   Social Determinants of Health   Financial Resource Strain: Medium Risk  . Difficulty of Paying Living Expenses: Somewhat hard  Food Insecurity: No Food Insecurity  . Worried About Charity fundraiser in the Last Year: Never true  . Ran Out of Food in the Last Year: Never true  Transportation Needs: Unmet Transportation Needs  . Lack of Transportation (Medical): Yes  . Lack of Transportation (Non-Medical): Yes  Physical Activity:   . Days of Exercise per Week:   . Minutes of Exercise per Session:   Stress: No Stress Concern Present  . Feeling of Stress : Only a little  Social Connections: Somewhat Isolated  . Frequency of Communication with Friends and Family: Three times a week  . Frequency of Social Gatherings with Friends and Family: Twice a week  . Attends Religious Services: 1 to 4 times per year  . Active Member of Clubs or Organizations: No  . Attends Theatre manager Meetings: Never  . Marital Status: Never married    Depression screen [facility] 2/9 02/24/2020  Decreased Interest 0  Down, Depressed, Hopeless 1  PHQ - 2 Score 1  Altered sleeping 1  Tired, decreased energy 1  Change in appetite 0  Feeling bad or failure about yourself  0  Trouble concentrating 0  Moving slowly or fidgety/restless 0  Suicidal thoughts 0  PHQ-9 Score 3  Difficult doing work/chores Somewhat difficult    GAD 7 : Generalized Anxiety Score 02/24/2020  Nervous, Anxious, on Edge 0  Control/stop worrying 0  Worry too much - different things 1  Trouble relaxing 0  Restless 1  Easily annoyed or irritable 0  Afraid - awful might happen 0  Total GAD 7 Score 2     Based on presentation no recommendations at the time of visit with LCSW.

## 2020-02-24 NOTE — Patient Instructions (Signed)
[employer] on [address].  Volleyball knee pads

## 2020-02-24 NOTE — Progress Notes (Signed)
Subjective:    Patient ID: Baird Lyons, female   DOB: 03/21/1961, 59 y.o.   MRN: [id number]   HPI   Here to establish  Interpreter:  Irving Shows  1.  Hypertension:  Diagnosed in about 2007.  Taking Lisinopril 10 mg daily.  States has been fairly well controlled with Lisinopril.    2.  Left hand DIP joint deformity.  When these joints started with pain, about 1 year ago, she also had swelling and redness overlying her 2nd and 3rd MCP joints.  The MCP areas are no longer a problem.   She has been seen for this in past and was told she had severe arthritis, but did not get a chance to follow up due to [place]. She has no other joints, particularly of other hand with similar symptoms or findings.  3.  Right knee:  Three years ago, developed swelling in her right knee requiring drainage.  Describes likely injection of corticosteroid as well.  Had significant relief of pain and swelling with this treatment.  Went to what sounds like [facility] on [address]. Currently knee is not causing a problem.   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   Similar problem with squatting.      Current Meds  Medication Sig  . COLLAGEN PO Take by mouth. 1 daily  . lisinopril (PRINIVIL,ZESTRIL) 10 MG tablet Take 10 mg by mouth daily.  . Multiple Vitamin (MULTIVITAMIN) tablet Take 1 tablet by mouth daily.  . naproxen sodium (ALEVE) 220 MG tablet Take 220 mg by mouth daily as needed.   No Known Allergies   Past Medical History:  Diagnosis Date  . GERD (gastroesophageal reflux disease)   . Hypertension 2006  . Postmenopausal     Past Surgical History:  Procedure Laterality Date  . CHOLECYSTECTOMY  10/16/2012   Procedure: LAPAROSCOPIC CHOLECYSTECTOMY;  Surgeon: Harl Bowie, MD;  Location: [place];  Service: General;  Laterality: N/A;  . MASS EXCISION  07/22/2012   lipoma on pathology report from R abdomen  . uterine cyst removed      2006    Family History  Problem Relation Age of Onset  . Hypertension Mother   . Arthritis Mother   . Hypertension Father   . Heart disease Father        MI likely cause of death  . Hypertension Sister     Family Status  Relation Name Status  . Mother  Alive, age 75y  . Father  Deceased at age 58  . Sister  Alive  . Brother  Other       Not sure if he is dead--disappeared when he was 71  . Daughter  Alive  . Son  Alive  . Sister  Alive  . Sister  Alive  . Sister  Alive  . Brother  Alive  . Daughter  Alive  . Daughter  Alive   Social History   Socioeconomic History  . Marital status: Single    Spouse name: Not on file  . Number of children: 4  . Years of education: Not on file  . Highest education level: 1st grade  Occupational History  . 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 Smoking status: Never Smoker  . Smokeless tobacco: Never Used  Vaping Use  . Vaping Use: Never used  Substance  and Sexual Activity  . Alcohol use: No  . Drug use: No  . Sexual activity: Not Currently  Other Topics Concern  . Not on file  Social History Narrative   Lives alone in [place]. Has good support network in town including family members. 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   Originally from [place]   Social Determinants of Health   Financial Resource Strain: Medium Risk  . Difficulty of Paying Living Expenses: Somewhat hard  Food Insecurity: No Food Insecurity  . Worried About Charity fundraiser in the Last Year: Never true  . Ran Out of Food in the Last Year: Never true  Transportation Needs: Unmet Transportation Needs  . Lack of Transportation (Medical): Yes  . Lack of Transportation (Non-Medical): Yes  Physical Activity:   . Days of Exercise per Week:   . Minutes of Exercise per Session:   Stress: No Stress Concern Present  . Feeling of Stress : Only a little    Social Connections: Moderately Isolated  . Frequency of Communication with Friends and Family: Three times a week  . Frequency of Social Gatherings with Friends and Family: Twice a week  . Attends Religious Services: 1 to 4 times per year  . Active Member of Clubs or Organizations: No  . Attends Archivist Meetings: Never  . Marital Status: Never married  Intimate Partner Violence: Not At Risk  . Fear of Current or Ex-Partner: No  . Emotionally Abused: No  . Physically Abused: No  . Sexually Abused: No       Review of Systems    Objective:   BP 134/82 (BP Location: Left Arm, Patient Position: Sitting, Cuff Size: Normal)   Pulse 72   Resp 12   Ht _0  (1.499 m)   Wt 137 lb (62.1 kg)   LMP 09/25/2012   BMI 27.67 kg/m   Physical Exam  NAD HEENT:  PERRL, EOMI, TMs pearly gray, throat without injection Neck:  Supple, No adenopathy, no thyromegaly Chest:  CTA CV:  RRR with normal S1 and S2, No S3, S4 or murmur.  No carotid bruits.  Carotid, radial and DP pulses normal and equal.  LE without edema. MS:  Swan neck like deformities of left ring and little fingers, mildly affecting PIPs and more significantly affecting DIPs Knees with thickening of skin with hyperpigmentation in subpatellar areas bilaterally.  Tender over right patellar area with perhaps some bogginess.  Assessment & Plan  1.  Hypertension:  Refill Lisinopril 10 mg daily.  CMP  2.  Joint deformities and complaints:  ANA, RF,  ESR, CBC, left hand xrays.  3.  Right prepatellar bursitis:  No kneeling or deep knee bending (appears she kneels frequently with skin changes below knees.  Consider cushioned knee pads for work if absolutely needs to kneel  Aleve with food as needed.  4.  HM:  FLP.  Release of info from ? [facility].

## 2020-02-25 LAB — CBC WITH DIFFERENTIAL/PLATELET
Basophils Absolute: 0.1 10*3/uL (ref 0.0–0.2)
Basos: 1 %
EOS (ABSOLUTE): 0.1 10*3/uL (ref 0.0–0.4)
Eos: 2 %
Hematocrit: 42.8 % (ref 34.0–46.6)
Hemoglobin: 14.4 g/dL (ref 11.1–15.9)
Immature Grans (Abs): 0 10*3/uL (ref 0.0–0.1)
Immature Granulocytes: 0 %
Lymphocytes Absolute: 1.5 10*3/uL (ref 0.7–3.1)
Lymphs: 19 %
MCH: 28.9 pg (ref 26.6–33.0)
MCHC: 33.6 g/dL (ref 31.5–35.7)
MCV: 86 fL (ref 79–97)
Monocytes Absolute: 0.5 10*3/uL (ref 0.1–0.9)
Monocytes: 6 %
Neutrophils Absolute: 5.6 10*3/uL (ref 1.4–7.0)
Neutrophils: 72 %
Platelets: 240 10*3/uL (ref 150–450)
RBC: 4.98 x10E6/uL (ref 3.77–5.28)
RDW: 13.3 % (ref 11.7–15.4)
WBC: 7.8 10*3/uL (ref 3.4–10.8)

## 2020-02-25 LAB — ANA: Anti Nuclear Antibody (ANA): NEGATIVE

## 2020-02-25 LAB — LIPID PANEL W/O CHOL/HDL RATIO
Cholesterol, Total: 190 mg/dL (ref 100–199)
HDL: 44 mg/dL (ref >39)
LDL Chol Calc (NIH): 127 mg/dL — ABNORMAL HIGH (ref 0–99)
Triglycerides: 104 mg/dL (ref 0–149)
VLDL Cholesterol Cal: 19 mg/dL (ref 5–40)

## 2020-02-25 LAB — COMPREHENSIVE METABOLIC PANEL
ALT: 19 IU/L (ref 0–32)
AST: 16 IU/L (ref 0–40)
Albumin/Globulin Ratio: 2 (ref 1.2–2.2)
Albumin: 4.5 g/dL (ref 3.8–4.9)
Alkaline Phosphatase: 96 IU/L (ref 48–121)
BUN/Creatinine Ratio: 23 (ref 9–23)
BUN: 14 mg/dL (ref 6–24)
Bilirubin Total: 0.6 mg/dL (ref 0.0–1.2)
CO2: 24 mmol/L (ref 20–29)
Calcium: 9.1 mg/dL (ref 8.7–10.2)
Chloride: 107 mmol/L — ABNORMAL HIGH (ref 96–106)
Creatinine, Ser: 0.62 mg/dL (ref 0.57–1.00)
GFR calc Af Amer: 115 mL/min/{1.73_m2} (ref >59)
GFR calc non Af Amer: 100 mL/min/{1.73_m2} (ref >59)
Globulin, Total: 2.3 g/dL (ref 1.5–4.5)
Glucose: 112 mg/dL — ABNORMAL HIGH (ref 65–99)
Potassium: 4 mmol/L (ref 3.5–5.2)
Sodium: 142 mmol/L (ref 134–144)
Total Protein: 6.8 g/dL (ref 6.0–8.5)

## 2020-02-25 LAB — RHEUMATOID FACTOR: Rheumatoid fact SerPl-aCnc: <10 IU/mL (ref 0.0–13.9)

## 2020-02-25 LAB — SEDIMENTATION RATE: Sed Rate: 4 mm/hr (ref 0–40)

## 2020-03-21 ENCOUNTER — Ambulatory Visit
Admission: RE | Admit: 2020-03-21 | Discharge: 2020-03-21 | Disposition: A | Discharge status: Home / Self Care | Payer: Self-pay | Source: Ambulatory Visit | Attending: Internal Medicine | Admitting: Internal Medicine

## 2020-03-21 DIAGNOSIS — M1990 Unspecified osteoarthritis, unspecified site: Secondary | ICD-10-CM

## 2020-05-01 DIAGNOSIS — M1990 Unspecified osteoarthritis, unspecified site: Secondary | ICD-10-CM | POA: Insufficient documentation

## 2020-05-01 DIAGNOSIS — M7041 Prepatellar bursitis, right knee: Secondary | ICD-10-CM | POA: Insufficient documentation

## 2020-06-29 ENCOUNTER — Encounter: Payer: Self-pay | Admitting: Internal Medicine

## 2020-06-29 ENCOUNTER — Ambulatory Visit (INDEPENDENT_AMBULATORY_CARE_PROVIDER_SITE_OTHER): Payer: Self-pay | Admitting: Internal Medicine

## 2020-06-29 VITALS — BP 130/74 | HR 64 | Resp 12 | Ht 59.0 in | Wt 141.0 lb

## 2020-06-29 DIAGNOSIS — M1990 Unspecified osteoarthritis, unspecified site: Secondary | ICD-10-CM

## 2020-06-29 DIAGNOSIS — R739 Hyperglycemia, unspecified: Secondary | ICD-10-CM

## 2020-06-29 DIAGNOSIS — Z0000 Encounter for general adult medical examination without abnormal findings: Secondary | ICD-10-CM

## 2020-06-29 DIAGNOSIS — I10 Essential (primary) hypertension: Secondary | ICD-10-CM

## 2020-06-29 MED ORDER — MELOXICAM 15 MG PO TABS
15.0000 mg | ORAL_TABLET | Freq: Every day | ORAL | 11 refills | Status: DC
Start: 2020-06-29 — End: 2020-12-28

## 2020-06-29 NOTE — Progress Notes (Signed)
Subjective:    Patient ID: Courtney Randolph, female   DOB: April 08, 1961, 59 y.o.   MRN: [id number]   HPI   CPE without pap  1.  Pap:  Normal pap in March 2021 with [facility].    2.  Mammogram:  Normal mammogram March 2021.  No family history of breast cancer.   3.  Osteoprevention:  Not much in way of dairy intake.  Does not tolerated milk due to bloating and diarrhea.  Tried soy milk in past, but too expensive.  No calcium or vitamin D supplementation.  She feels she can drink 3-4 cups of milk daily.  She is physically active with her job.    4.  Guaiac Cards:  Never.      5.  Colonoscopy:  Never.   No family history of colon cancer.    6.  Immunizations:   Immunization History  Administered Date(s) Administered   Influenza Split 06/19/2012   PFIZER SARS-COV-2 Vaccination 02/01/2020, 02/22/2020     7.  Glucose/Cholesterol:  History of mildly elevated glucose in May.  Cholesterol panel borderline high with total and LDL.  HDL mildly low.  Lipid Panel     Component Value Date/Time   CHOL 190 02/24/2020 1041   TRIG 104 02/24/2020 1041   HDL 44 02/24/2020 1041   LDLCALC 127 (H) 02/24/2020 1041   LABVLDL 19 02/24/2020 1041     Current Meds  Medication Sig   lisinopril (ZESTRIL) 10 MG tablet Take 1 tablet (10 mg total) by mouth daily.   naproxen sodium (ALEVE) 220 MG tablet Take 220 mg by mouth daily as needed.   No Known Allergies   Past Medical History:  Diagnosis Date   GERD (gastroesophageal reflux disease)    Hypertension 2006   Postmenopausal     Past Surgical History:  Procedure Laterality Date   CHOLECYSTECTOMY  10/16/2012   Procedure: LAPAROSCOPIC CHOLECYSTECTOMY;  Surgeon: Harl Bowie, MD;  Location: [place];  Service: General;  Laterality: N/A;   MASS EXCISION  07/22/2012   lipoma on pathology report from R abdomen   uterine cyst removed     2006    Family History  Problem Relation Age of Onset   Hypertension Mother    Arthritis Mother     Hypertension Father    Heart disease Father        MI likely cause of death   Hypertension Sister     Social History   Socioeconomic History   Marital status: Single    Spouse name: Not on file   Number of children: 4   Years of education: Not on file   Highest education level: 1st grade  Occupational History   Occupation: Actuary and Estate manager/land agent: [employer]    Comment: Therapist, nutritional  Tobacco Use   Smoking status: Never Smoker   Smokeless tobacco: Never Used  Scientific laboratory technician Use: Never used  Substance and Sexual Activity   Alcohol use: No   Drug use: No   Sexual activity: Not Currently  Other Topics Concern   Not on file  Social History Narrative   Lives alone in [place]. Has good support network in town including family members. 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   Originally from [place]   Social Determinants of Health   Financial Resource Strain: Medium Risk   Difficulty of Paying Living Expenses: Somewhat hard  Food Insecurity: No Food Insecurity  Worried About Charity fundraiser in the Last Year: Never true   [place] in the Last Year: Never true  Transportation Needs: No Transportation Needs   Lack of Transportation (Medical): No   Lack of Transportation (Non-Medical): No  Physical Activity:    Days of Exercise per Week: Not on file   Minutes of Exercise per Session: Not on file  Stress: No Stress Concern Present   Feeling of Stress : Only a little  Social Connections: Moderately Isolated   Frequency of Communication with Friends and Family: Three times a week   Frequency of Social Gatherings with Friends and Family: Twice a week   Attends Religious Services: 1 to 4 times per year   Active Member of [employer] or Organizations: No   Attends Music therapist: Never   Marital Status: Never married  Human resources officer Violence: Not At Risk   Fear of Current or Ex-Partner: No    Emotionally Abused: No   Physically Abused: No   Sexually Abused: No      Review of Systems  Constitutional: Negative for appetite change and fatigue.  HENT: Negative for dental problem, rhinorrhea and sore throat.   Eyes: Negative for visual disturbance (Though cannot read small print well.  Has not tried reading glasses.).  Respiratory: Negative for cough and shortness of breath.   Cardiovascular: Positive for chest pain (Denies pain but then states gets stabbing type discomfort in her high left anterior chest at times.  Lasts seconds and gone.  Can happen at rest or with activity.   She may actually increase her activity when occurs and goes away despite this.). Negative for palpitations and leg swelling.  Gastrointestinal: Negative for abdominal pain, blood in stool (No melena), constipation and diarrhea.  Genitourinary: Negative for dysuria, frequency, vaginal bleeding and vaginal discharge.  Musculoskeletal: Positive for arthralgias (Knee pain still.  Felt to be prepatellar bursitis.  Because of changes to some of her fingers, Xrays obtained without erosion.  Xrays normal.  States had fluid on her knees removed at a [facility] clinic maybe 3 weeks ago.  Do not know what was done. ).       See Arthralgias:  Sed Rate, ANA, RF all negative in May when seen for hands and knees discomfort.   Skin: Negative for rash.  Neurological: Negative for weakness and numbness.  Psychiatric/Behavioral: Negative for dysphoric mood. The patient is not nervous/anxious.       Objective:   BP 130/74 (BP Location: Left Arm, Patient Position: Sitting, Cuff Size: Normal)    Pulse 64    Resp 12    Ht 4\' 11"  (1.499 m)    Wt 141 lb (64 kg)    LMP 09/25/2012    BMI 28.48 kg/m   Physical Exam Constitutional:      Appearance: Normal appearance.  HENT:     Head: Normocephalic and atraumatic.     Right Ear: Hearing, tympanic membrane, ear canal and external ear normal.     Left Ear: Hearing, tympanic  membrane, ear canal and external ear normal.     Nose: Nose normal.     Mouth/Throat:     Mouth: Mucous membranes are moist.     Dentition: Has dentures (upper and lower).  Eyes:     Extraocular Movements: Extraocular movements intact.     Conjunctiva/sclera: Conjunctivae normal.     Pupils: Pupils are equal, round, and reactive to light.     Comments: Discs sharp  Cardiovascular:     Rate and Rhythm: Normal rate and regular rhythm.     Heart sounds: S1 normal and S2 normal. No murmur heard.  No friction rub. No S3 or S4 sounds.      Comments: No carotid bruits.  Carotid, radial, femoral, DP and PT pulses normal and equal.  Pulmonary:     Effort: Pulmonary effort is normal.     Breath sounds: Normal breath sounds.  Chest:     Comments: Breast exam deferred as done in March with [facility] Abdominal:     General: Bowel sounds are normal.     Palpations: Abdomen is soft. There is no hepatomegaly, splenomegaly or mass.     Tenderness: There is no abdominal tenderness.     Hernia: No hernia is present.  Genitourinary:    Comments: Deferred as performed in March with [facility] Musculoskeletal:        General: Deformity (heberdons nodes of left 5th DIP, Almost swan neck deformities of ring and index fingers bilaterally.  No swelling, erythema or synovial thickening.) present. Normal range of motion.     Cervical back: Full passive range of motion without pain, normal range of motion and neck supple.     Comments: Right and left knees with full ROM.  Some crepitation of right patella with manipulation from side to side.  Both knees without effusion bilaterally.  No joint line tenderness or laxity of cruciates or collateral ligaments with stress maneuvers.  Lymphadenopathy:     Head:     Right side of head: No submental or submandibular adenopathy.     Left side of head: No submental or submandibular adenopathy.     Cervical: No cervical adenopathy.     Upper Body:     Right upper body: No  supraclavicular or axillary adenopathy.     Left upper body: No supraclavicular or axillary adenopathy.     Lower Body: No right inguinal adenopathy. No left inguinal adenopathy.  Skin:    General: Skin is warm.     Capillary Refill: Capillary refill takes less than 2 seconds.     Findings: No rash.  Neurological:     Mental Status: She is alert and oriented to person, place, and time.     Cranial Nerves: Cranial nerves are intact.     Sensory: Sensation is intact.     Motor: Motor function is intact.     Coordination: Coordination is intact.     Gait: Gait is intact.     Deep Tendon Reflexes: Reflexes are normal and symmetric.  Psychiatric:        Attention and Perception: Attention normal.        Mood and Affect: Mood normal.        Speech: Speech normal.        Behavior: Behavior normal.        Thought Content: Thought content normal.        Cognition and Memory: Cognition normal.        Judgment: Judgment normal.      Assessment & Plan   CPE without pap Guaiac Cards x 3 to return in 2 weeks.  2.  GCCN orange card application:  Called orange card:  She did not have supporting documents, they could not get hold of her and shredded her application after 90 days. Discussed importance of having voicemailbox set up and listening to voicemails if she cannot answer phone during day.  3.  History of hyperglycemia:  A1C  4.  Borderline cholesterol:  to work on diet and physical activity.  5.  Hypertension:  controlled.  6.  Osteoarthritis:  Meloxicam daily.  Follow up in 6 months to see how doing.

## 2020-06-30 LAB — HGB A1C W/O EAG: Hgb A1c MFr Bld: 5.8 % — ABNORMAL HIGH (ref 4.8–5.6)

## 2020-07-11 ENCOUNTER — Other Ambulatory Visit (INDEPENDENT_AMBULATORY_CARE_PROVIDER_SITE_OTHER): Payer: Self-pay

## 2020-07-11 DIAGNOSIS — Z1211 Encounter for screening for malignant neoplasm of colon: Secondary | ICD-10-CM

## 2020-07-11 LAB — POC HEMOCCULT BLD/STL (HOME/3-CARD/SCREEN)
Card #2 Fecal Occult Blod, POC: NEGATIVE
Card #3 Fecal Occult Blood, POC: NEGATIVE
Fecal Occult Blood, POC: NEGATIVE

## 2020-12-28 ENCOUNTER — Other Ambulatory Visit: Payer: Self-pay

## 2020-12-28 ENCOUNTER — Ambulatory Visit: Payer: Self-pay | Admitting: Internal Medicine

## 2020-12-28 ENCOUNTER — Encounter: Payer: Self-pay | Admitting: Internal Medicine

## 2020-12-28 VITALS — BP 138/86 | HR 69 | Resp 12 | Ht 59.0 in | Wt 142.5 lb

## 2020-12-28 DIAGNOSIS — I10 Essential (primary) hypertension: Secondary | ICD-10-CM

## 2020-12-28 DIAGNOSIS — M1711 Unilateral primary osteoarthritis, right knee: Secondary | ICD-10-CM | POA: Insufficient documentation

## 2020-12-28 MED ORDER — SUPER B COMPLEX MAXI PO TABS: ORAL_TABLET | ORAL | Status: DC

## 2020-12-28 MED ORDER — DICLOFENAC SODIUM 75 MG PO TBEC: DELAYED_RELEASE_TABLET | ORAL | 6 refills | Status: DC

## 2020-12-28 NOTE — Progress Notes (Signed)
    Subjective:    Patient ID: Courtney Randolph, female   DOB: [birth year], 60 y.o.   MRN: [id number]   HPI   1.  Arthritis:  Started a "vitamin" from [place].  Contains Vitamins B1, B6, B12 and NSAID Diclofenac 50 mg.  Has taken 6 tabs over a time period of 2 weeks for her right knee pain.  Feels it helps.  Last dose was 4 days ago.  Her right knee is really bothering her today--since yesterday.   Stopped taking Meloxicam about 2-3 months ago as felt it was causing epigastric pain.  No melena or hematochezia.   Has not taken Aleve for about 1 week. With the Diclofenac, feels she can get what she needs to get done done without pain for about 2 days.    2.  Hypertension:  Did not take her Lisinopril today as was afraid she would have to urinate on way to office.    Current Meds  Medication Sig  . COLLAGEN PO Take by mouth. 1 daily  . lisinopril (ZESTRIL) 10 MG tablet Take 1 tablet (10 mg total) by mouth daily.  . meloxicam (MOBIC) 15 MG tablet Take 1 tablet (15 mg total) by mouth daily.   No Known Allergies   Review of Systems    Objective:   BP (!) 138/92 (BP Location: Right Arm, Patient Position: Sitting, Cuff Size: Normal)   Pulse 69   Resp 12   Ht 4\' 11"  (1.499 m)   Wt 142 lb 8 oz (64.6 kg)   LMP 09/25/2012   BMI 28.78 kg/m   Physical Exam  NAD CV:  RRR Radial and DP pulses normal and equal Right knee:  Tender along medial patella and with stress of medial collateral, but not from the collateral ligament itself--points to medial patella as source.   Full ROM No laxity with stress of cruciates or collateral ligament. No palpable effusion over patella or of knee joint    Assessment & Plan  1.  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  Diclofenac 75 mg every 1-2 days with food.  Cautioned to avoid other NSAIDS over the counter. Okay to also take a Super B vitamin, which she has been using also in med from [place]. Call if problems with  Diclofenac. Meloxicam and Naproxen removed from med list.  2.  Hypertension:  Encouraged never to miss or take bp meds late.

## 2020-12-28 NOTE — Patient Instructions (Signed)
No mas naproxen o meloxicam

## 2021-01-16 ENCOUNTER — Telehealth: Payer: Self-pay | Admitting: Internal Medicine

## 2021-01-16 NOTE — Telephone Encounter (Signed)
Patient confirmed that she was aware and will be attending her appt with [facility] 01/17/21 at 9:45am - [place]. I advise the patient to arrive 15 mins earlier.

## 2021-03-27 ENCOUNTER — Other Ambulatory Visit: Payer: Self-pay | Admitting: Internal Medicine

## 2021-04-09 ENCOUNTER — Telehealth: Payer: Self-pay | Admitting: Internal Medicine

## 2021-04-09 NOTE — Telephone Encounter (Signed)
Notified pt of lab results. Pt expressed that she still feels pain in her knee. Yesterday 04/08/21 pain was worse then the usual. Medication that was given sometimes works. She is not taking addition medication from what is already prescribed.

## 2021-06-28 ENCOUNTER — Ambulatory Visit: Payer: Self-pay | Admitting: Internal Medicine

## 2021-06-28 ENCOUNTER — Encounter: Payer: Self-pay | Admitting: Internal Medicine

## 2021-06-28 ENCOUNTER — Other Ambulatory Visit: Payer: Self-pay

## 2021-06-28 VITALS — BP 114/70 | HR 64 | Resp 16 | Ht 59.25 in | Wt 142.0 lb

## 2021-06-28 DIAGNOSIS — Z23 Encounter for immunization: Secondary | ICD-10-CM

## 2021-06-28 DIAGNOSIS — Z604 Social exclusion and rejection: Secondary | ICD-10-CM

## 2021-06-28 DIAGNOSIS — Z972 Presence of dental prosthetic device (complete) (partial): Secondary | ICD-10-CM

## 2021-06-28 DIAGNOSIS — Z0000 Encounter for general adult medical examination without abnormal findings: Secondary | ICD-10-CM

## 2021-06-28 DIAGNOSIS — Z124 Encounter for screening for malignant neoplasm of cervix: Secondary | ICD-10-CM

## 2021-06-28 DIAGNOSIS — Z1231 Encounter for screening mammogram for malignant neoplasm of breast: Secondary | ICD-10-CM

## 2021-06-28 NOTE — Progress Notes (Signed)
Subjective:    Patient ID: Courtney Randolph, female   DOB: [birth year], 60 y.o.   MRN: [id number]   HPI  CPE without pap  1.  Pap:  Last pap normal 11/30/2019 with [facility].    2.  Mammogram:  Last mammogram 11/2019 through [facility] and normal.  States she did not receive a letter to schedule this year.  No family history of breast cancer.    3.  Osteoprevention:  Only one glass of almond milk daily.  Willing to increase to 3-4 glasses daily.  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   4.  Guaiac Cards/FIT:  Last performed 07/11/20 and negative for blood.  5.  Colonoscopy:  Never.  No family history of colon cancer.   6.  Immunizations:  No influenza vaccine this year.  Does not obtain as she feels like she has the flu after obtaining in past.   No tetanus in past 10 years. Has had 2 COVID vaccinations with [facility], last 02/22/20 Has not had Shingles vaccination.  7.  Glucose/Cholesterol :  History of prediabetes with A1C of 5.8% last year.  Cholesterol with a bit high LDL last spring.   Lipid Panel     Component Value Date/Time   CHOL 190 02/24/2020 1041   TRIG 104 02/24/2020 1041   HDL 44 02/24/2020 1041   LDLCALC 127 (H) 02/24/2020 1041   LABVLDL 19 02/24/2020 1041     Current Meds  Medication Sig   diclofenac (VOLTAREN) 75 MG EC tablet 1 tab by mouth daily as needed for knee pain   lisinopril (ZESTRIL) 10 MG tablet Take 1 tablet by mouth once daily   No Known Allergies  Past Medical History:  Diagnosis Date   GERD (gastroesophageal reflux disease)    Hypertension 2006   Postmenopausal    Past Surgical History:  Procedure Laterality Date   CHOLECYSTECTOMY  10/16/2012   Procedure: LAPAROSCOPIC CHOLECYSTECTOMY;  Surgeon: Harl Bowie, MD;  Location: [place];  Service: General;  Laterality: N/A;   MASS EXCISION  07/22/2012   lipoma on pathology report from R abdomen   uterine cyst removed     2006   Family History  Problem Relation Age of Onset    Hypertension Mother    Arthritis Mother    Hypertension Father    Heart disease Father        MI likely cause of death   Hypertension Sister    Breast cancer Neg Hx    Social History   Socioeconomic History   Marital status: Single    Spouse name: Not on file   Number of children: 4   Years of education: Not on file   Highest education level: 1st grade  Occupational History   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   Originally from [place]   Social Determinants of Health   Financial Resource Strain: Medium Risk (06/28/2021)   Overall Financial Resource Strain (CARDIA)    Difficulty of Paying  Living Expenses: Somewhat hard  Food Insecurity: No Food Insecurity (08/14/2021)   Hunger Vital Sign    Worried About Running Out of Food in the Last Year: Never true    Ran Out of Food in the Last Year: Never true  Transportation Needs: No Transportation Needs (08/14/2021)   PRAPARE - Hydrologist (Medical): No    Lack of Transportation (Non-Medical): No  Physical Activity: Not on file  Stress: No Stress Concern Present (02/24/2020)   [place]    Feeling of Stress : Only a little  Social Connections: Unknown (06/28/2021)   Social Connection and Isolation Panel [NHANES]    Frequency of Communication with Friends and Family: Not on file    Frequency of Social Gatherings with Friends and Family: Not on file    Attends Religious Services: Not on file    Active Member of Clubs or  Organizations: No    Attends Archivist Meetings: Not on file    Marital Status: Not on file  Intimate Partner Violence: Not At Risk (06/28/2021)   Humiliation, Afraid, Rape, and Kick questionnaire    Fear of Current or Ex-Partner: No    Emotionally Abused: No    Physically Abused: No    Sexually Abused: No      Review of Systems  Respiratory:  Negative for shortness of breath.   Cardiovascular:  Negative for chest pain.  Neurological:        Left upper eyelid with a lot of twitching for 3 weeks, stopping 2 weeks. Was stressed a bit by increasing rent-was concerned about what she would do if she lost her job.     Objective:   BP 114/70 (BP Location: Right Arm, Patient Position: Sitting, Cuff Size: Normal)   Pulse 64   Resp 16   Ht 4' 11.25" (1.505 m)   Wt 142 lb (64.4 kg)   LMP 09/25/2012   BMI 28.44 kg/m   Physical Exam HENT:     Head: Normocephalic and atraumatic.     Right Ear: Tympanic membrane, ear canal and external ear normal.     Left Ear: Tympanic membrane, ear canal and external ear normal.     Nose: Nose normal.     Mouth/Throat:     Mouth: Mucous membranes are dry.     Pharynx: Oropharynx is clear.  Eyes:     Extraocular Movements: Extraocular movements intact.     Conjunctiva/sclera: Conjunctivae normal.     Pupils: Pupils are equal, round, and reactive to light.     Comments: Discs sharp  Neck:     Thyroid: No thyroid mass or thyromegaly.  Cardiovascular:     Rate and Rhythm: Normal rate and regular rhythm.     Heart sounds: S1 normal and S2 normal. No murmur heard.    No friction rub. No S3 or S4 sounds.     Comments: No carotid bruits.  Carotid, radial, femoral, DP and PT pulses normal and equal.    Pulmonary:     Effort: Pulmonary effort is normal.     Breath sounds: Normal breath sounds.  Abdominal:     General: Bowel sounds are normal.     Palpations: Abdomen is soft. There is no mass.     Tenderness: There is no abdominal  tenderness.     Hernia: No hernia is present.  Genitourinary:    Comments: Refused pelvic exam. Musculoskeletal:  General: Normal range of motion.     Cervical back: Normal range of motion and neck supple.     Right lower leg: No edema.     Left lower leg: No edema.  Skin:    General: Skin is warm.     Capillary Refill: Capillary refill takes less than 2 seconds.     Findings: No rash.  Neurological:     General: No focal deficit present.     Mental Status: She is alert and oriented to person, place, and time.  Psychiatric:        Behavior: Behavior normal.      Assessment & Plan    CPE without pap Tdap and Pfizer bivalent today Tdap  Hold on Influenza and Shingrix. FIT to return in 2 weeks. Mammogram  2.  Social Isolation:  will have Leeann Must speak with patient about joining group for [place].  3.  Dentures:  needs to know where she can get new dentures.  Referral to dental clinic.

## 2021-07-10 ENCOUNTER — Other Ambulatory Visit: Payer: Self-pay | Admitting: Obstetrics and Gynecology

## 2021-07-10 DIAGNOSIS — Z1231 Encounter for screening mammogram for malignant neoplasm of breast: Secondary | ICD-10-CM

## 2021-07-12 ENCOUNTER — Other Ambulatory Visit: Payer: Self-pay

## 2021-07-12 DIAGNOSIS — R739 Hyperglycemia, unspecified: Secondary | ICD-10-CM

## 2021-07-12 DIAGNOSIS — Z13220 Encounter for screening for lipoid disorders: Secondary | ICD-10-CM

## 2021-07-12 DIAGNOSIS — Z1211 Encounter for screening for malignant neoplasm of colon: Secondary | ICD-10-CM

## 2021-07-12 DIAGNOSIS — Z79899 Other long term (current) drug therapy: Secondary | ICD-10-CM

## 2021-07-12 LAB — POC FIT TEST STOOL: Fecal Occult Blood: NEGATIVE

## 2021-07-12 NOTE — Addendum Note (Signed)
Addended by: Mariah Milling on: 07/12/2021 11:59 AM   Modules accepted: Orders

## 2021-07-13 LAB — LIPID PANEL W/O CHOL/HDL RATIO
Cholesterol, Total: 197 mg/dL (ref 100–199)
HDL: 40 mg/dL (ref >39)
LDL Chol Calc (NIH): 136 mg/dL — ABNORMAL HIGH (ref 0–99)
Triglycerides: 117 mg/dL (ref 0–149)
VLDL Cholesterol Cal: 21 mg/dL (ref 5–40)

## 2021-07-13 LAB — COMPREHENSIVE METABOLIC PANEL
ALT: 18 IU/L (ref 0–32)
AST: 16 IU/L (ref 0–40)
Albumin/Globulin Ratio: 2 (ref 1.2–2.2)
Albumin: 4.4 g/dL (ref 3.8–4.9)
Alkaline Phosphatase: 97 IU/L (ref 44–121)
BUN/Creatinine Ratio: 18 (ref 9–23)
BUN: 13 mg/dL (ref 6–24)
Bilirubin Total: 0.7 mg/dL (ref 0.0–1.2)
CO2: 24 mmol/L (ref 20–29)
Calcium: 9.1 mg/dL (ref 8.7–10.2)
Chloride: 103 mmol/L (ref 96–106)
Creatinine, Ser: 0.71 mg/dL (ref 0.57–1.00)
Globulin, Total: 2.2 g/dL (ref 1.5–4.5)
Glucose: 104 mg/dL — ABNORMAL HIGH (ref 70–99)
Potassium: 4 mmol/L (ref 3.5–5.2)
Sodium: 141 mmol/L (ref 134–144)
Total Protein: 6.6 g/dL (ref 6.0–8.5)
eGFR: 98 mL/min/{1.73_m2} (ref >59)

## 2021-07-13 LAB — CBC WITH DIFFERENTIAL/PLATELET
Basophils Absolute: 0.1 10*3/uL (ref 0.0–0.2)
Basos: 1 %
EOS (ABSOLUTE): 0.1 10*3/uL (ref 0.0–0.4)
Eos: 1 %
Hematocrit: 41.3 % (ref 34.0–46.6)
Hemoglobin: 14.5 g/dL (ref 11.1–15.9)
Immature Grans (Abs): 0 10*3/uL (ref 0.0–0.1)
Immature Granulocytes: 0 %
Lymphocytes Absolute: 1.8 10*3/uL (ref 0.7–3.1)
Lymphs: 25 %
MCH: 28.8 pg (ref 26.6–33.0)
MCHC: 35.1 g/dL (ref 31.5–35.7)
MCV: 82 fL (ref 79–97)
Monocytes Absolute: 0.4 10*3/uL (ref 0.1–0.9)
Monocytes: 6 %
Neutrophils Absolute: 4.8 10*3/uL (ref 1.4–7.0)
Neutrophils: 67 %
Platelets: 234 10*3/uL (ref 150–450)
RBC: 5.04 x10E6/uL (ref 3.77–5.28)
RDW: 13.1 % (ref 11.7–15.4)
WBC: 7.2 10*3/uL (ref 3.4–10.8)

## 2021-07-13 LAB — HEMOGLOBIN A1C
Est. average glucose Bld gHb Est-mCnc: 114 mg/dL
Hgb A1c MFr Bld: 5.6 % (ref 4.8–5.6)

## 2021-07-19 ENCOUNTER — Other Ambulatory Visit: Payer: Self-pay

## 2021-08-14 ENCOUNTER — Other Ambulatory Visit: Payer: Self-pay

## 2021-08-14 ENCOUNTER — Ambulatory Visit
Admission: RE | Admit: 2021-08-14 | Discharge: 2021-08-14 | Disposition: A | Discharge status: Home / Self Care | Payer: No Typology Code available for payment source | Source: Ambulatory Visit | Attending: Obstetrics and Gynecology | Admitting: Obstetrics and Gynecology

## 2021-08-14 ENCOUNTER — Ambulatory Visit: Payer: Self-pay | Admitting: *Deleted

## 2021-08-14 ENCOUNTER — Encounter (INDEPENDENT_AMBULATORY_CARE_PROVIDER_SITE_OTHER): Payer: Self-pay

## 2021-08-14 VITALS — BP 118/74 | Wt 142.5 lb

## 2021-08-14 DIAGNOSIS — Z1239 Encounter for other screening for malignant neoplasm of breast: Secondary | ICD-10-CM

## 2021-08-14 DIAGNOSIS — Z1231 Encounter for screening mammogram for malignant neoplasm of breast: Secondary | ICD-10-CM

## 2021-08-14 NOTE — Progress Notes (Signed)
Ms. Courtney Randolph is a 60 y.o. female who presents to [facility] clinic today with no complaints.    Pap Smear: Pap smear not completed today. Last Pap smear was 11/30/2019 at [facility] clinic and was normal with negative HPV. Per patient has no history of an abnormal Pap smear. Last Pap smear result is available in Epic.   Physical exam: Breasts Breasts symmetrical. No skin abnormalities bilateral breasts. No nipple retraction bilateral breasts. No nipple discharge bilateral breasts. No lymphadenopathy. No lumps palpated bilateral breasts. No complaints of pain or tenderness on exam.  MS DIGITAL SCREENING TOMO BILATERAL  Result Date: 12/01/2019 CLINICAL DATA:  Screening. EXAM: DIGITAL SCREENING BILATERAL MAMMOGRAM WITH TOMO AND CAD COMPARISON:  Previous exam(s). ACR Breast Density Category b: There are scattered areas of fibroglandular density. FINDINGS: There are no findings suspicious for malignancy. Images were processed with CAD. IMPRESSION: No mammographic evidence of malignancy. A result letter of this screening mammogram will be mailed directly to the patient. RECOMMENDATION: Screening mammogram in one year. (Code:SM-B-01Y) BI-RADS CATEGORY  1: Negative. Electronically Signed   By: Marin Olp M.D.   On: 12/01/2019 13:06         Pelvic/Bimanual Pap is not indicated today per BCCCP guidelines.   Smoking History: Patient has never smoked.   Patient Navigation: Patient education provided. Access to services provided for patient through [place] program. Spanish interpreter Rudene Anda from [facility] provided.   Colorectal Cancer Screening: Per patient has never had colonoscopy completed. FIT Test completed 07/12/2021 that was negative. No complaints today.    Breast and Cervical Cancer Risk Assessment: Patient does not have family history of breast cancer, known genetic mutations, or radiation treatment to the chest before age 59. Patient does not have history of cervical dysplasia,  immunocompromised, or DES exposure in-utero.  Risk Assessment     Risk Scores       08/14/2021 11/30/2019   Last edited by: Demetrius Revel, LPN McGill, Sherie Mamie Nick, LPN   5-year risk: 0.6 % 0.6 %   Lifetime risk: 3.4 % 3.5 %            A: BCCCP exam without pap smear No complaints.  P: Referred patient to the [facility] [facility] for a screening mammogram on mobile unit. Appointment scheduled Tuesday, August 14, 2021 at 1520.  Loletta Parish, RN 08/14/2021 2:30 PM

## 2021-08-14 NOTE — Patient Instructions (Signed)
Explained breast self awareness with Courtney Randolph. Patient did not need a Pap smear today due to last Pap smear and HPV typing was 11/30/2019. Let her know BCCCP will cover Pap smears and HPV typing every 5 years unless has a history of abnormal Pap smears. Referred patient to the [facility] for a screening mammogram on mobile unit. Appointment scheduled Tuesday, August 14, 2021 at 1520. Patient escorted to the mobile unit following BCCCP appointment for her screening mammogram. Let patient know the [facility] will follow up with her within the next couple weeks with results of her mammogram by letter or phone. Courtney Randolph verbalized understanding.  Courtney Randolph, Arvil Chaco, RN 2:30 PM

## 2021-09-30 DIAGNOSIS — E785 Hyperlipidemia, unspecified: Secondary | ICD-10-CM

## 2021-09-30 HISTORY — DX: Hyperlipidemia, unspecified: E78.5

## 2021-11-20 ENCOUNTER — Telehealth: Payer: Self-pay

## 2021-11-20 NOTE — Telephone Encounter (Signed)
Patient would like an appointment for left wrist pain. 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 She has pain when moving it in any direction. Has some inflammation and redness on her wrist. Has taken ibuprofen which has helped some with the pain.

## 2021-11-21 NOTE — Telephone Encounter (Signed)
Acute appt or wait list

## 2021-11-30 NOTE — Telephone Encounter (Signed)
Patient has been scheduled for acute ?

## 2021-12-04 ENCOUNTER — Ambulatory Visit: Payer: Self-pay | Admitting: Internal Medicine

## 2021-12-04 ENCOUNTER — Other Ambulatory Visit: Payer: Self-pay

## 2021-12-04 ENCOUNTER — Encounter: Payer: Self-pay | Admitting: Internal Medicine

## 2021-12-04 VITALS — BP 126/74 | HR 72 | Resp 12 | Ht 59.25 in | Wt 140.0 lb

## 2021-12-04 DIAGNOSIS — M654 Radial styloid tenosynovitis [de Quervain]: Secondary | ICD-10-CM

## 2021-12-04 MED ORDER — IBUPROFEN 200 MG PO TABS: ORAL_TABLET | ORAL | 0 refills | Status: DC

## 2021-12-04 NOTE — Patient Instructions (Signed)
[facility] ?[address], [place], [place] [address] ?[phone] ? ? ? ?[facility] ?[place] [address] ?[phone] ? ?

## 2021-12-04 NOTE — Progress Notes (Signed)
    Subjective:    Patient ID: Courtney Randolph, female   DOB: 1961-09-22, 61 y.o.   MRN: [id number]   HPI  Interpreted by [name]  Left wrist pain for 1 month.  Pushes a cart around at work and when pulling the cart prior to pain starting, felt like "something came apart. "  She did not hear anything snap.  About 2 hours later, she lifted a small box with the same hand.  The box was a lot heavier than she anticipated and really made the pain worse.   Has taken tylenol, but limited relief.   Did take ibuprofen earlier, but only 200 mg twice daily.  Current Meds  Medication Sig   acetaminophen (TYLENOL) 500 MG tablet Take 500 mg by mouth every 6 (six) hours as needed.   lisinopril (ZESTRIL) 10 MG tablet Take 1 tablet by mouth once daily   No Known Allergies   Review of Systems    Objective:   BP 126/74 (BP Location: Left Arm, Patient Position: Sitting, Cuff Size: Normal)   Pulse 72   Resp 12   Ht 4' 11.25" (1.505 m)   Wt 140 lb (63.5 kg)   LMP 09/25/2012   BMI 28.04 kg/m   Physical Exam Obvious swelling over Hallucis tendons of left thumb/wrist. + Finkelstein test. Full ROM of thumb and hand.   Assessment & Plan   De Quervain's tenosynovitis of left thumb.  Spica splint.  Save for when bathing/washing hands.  Ibuprofen 600 mg twice daily with meals for 2 weeks.  Call if not improved.

## 2021-12-25 ENCOUNTER — Ambulatory Visit (INDEPENDENT_AMBULATORY_CARE_PROVIDER_SITE_OTHER): Payer: Self-pay | Admitting: Orthopedic Surgery

## 2021-12-25 ENCOUNTER — Encounter: Payer: Self-pay | Admitting: Orthopedic Surgery

## 2021-12-25 DIAGNOSIS — M79642 Pain in left hand: Secondary | ICD-10-CM

## 2021-12-25 DIAGNOSIS — M654 Radial styloid tenosynovitis [de Quervain]: Secondary | ICD-10-CM | POA: Insufficient documentation

## 2021-12-25 MED ORDER — BETAMETHASONE SOD PHOS & ACET 6 (3-3) MG/ML IJ SUSP
6.0000 mg | INTRAMUSCULAR | Status: AC | PRN
  Administered 2021-12-25: 6 mg via INTRA_ARTICULAR

## 2021-12-25 MED ORDER — LIDOCAINE HCL 1 % IJ SOLN
1.0000 mL | INTRAMUSCULAR | Status: AC | PRN
  Administered 2021-12-25: 1 mL

## 2021-12-25 NOTE — Progress Notes (Signed)
? ?Office Visit Note ?  ?Patient: Courtney Randolph           ?Date of Birth: 1961/01/14           ?MRN: [id number] ?Visit Date: 12/25/2021 ?             ?Requested by: Mack Hook, MD ?[address] ?[place],  [place] [address] ?PCP: Mack Hook, MD ? ? ?Assessment & Plan: ?Visit Diagnoses:  ?1. Tenosynovitis, de Quervain   ? ? ?Plan: We discussed the diagnosis, prognosis, non-operative and operative treatment options for De Quervain's tenosynovitis. ? ?After our discussion, the patient would like to proceed with corticosteroid injection in the first dorsal compartment.  We reviewed the risks and benefits of conservative management.  The patient expressed understanding of the reasoning and strategy going forward. ? ?All patient questions and concerns were addressed.  ? ? ?Follow-Up Instructions: No follow-ups on file.  ? ?Orders:  ?No orders of the defined types were placed in this encounter. ? ?No orders of the defined types were placed in this encounter. ? ? ? ? Procedures: ?Hand/UE Inj: L extensor compartment 1 for de Quervain's tenosynovitis on 12/25/2021 10:06 AM ?Indications: pain and therapeutic ?Details: 25 G needle, radial approach ?Medications: 1 mL lidocaine 1 %; 6 mg betamethasone acetate-betamethasone sodium phosphate 6 (3-3) MG/ML ?Procedure, treatment alternatives, risks and benefits explained, specific risks discussed. Consent was given by the patient. Immediately prior to procedure a time out was called to verify the correct patient, procedure, equipment, support staff and site/side marked as required. Patient was prepped and draped in the usual sterile fashion.  ? ? ? ? ?Clinical Data: ?No additional findings. ? ? ?Subjective: ?Chief Complaint  ?Patient presents with  ? Left Hand - Pain  ?  RIGHT handed, Grabbed cart wrong with hand and hurt it. Hurts to grab things. Pain:8/10, onset Jan. 2023  ? ? ?This is a 61 year old right-hand-dominant female presents with pain and swelling at the left  radial side of the wrist.  This happened at the end of January.  She notes that she was she grabbed a cart the wrong way and noted sharp pain at the radial side of her wrist.  Pain is localized to the area around the radial styloid.  It hurts when she grabs and lift things.  She has tried a removable thumb spica brace and ibuprofen with mild symptom relief.  She denies pain at the thumb [facility] joint.  She has no triggering of the thumb.  She denies pain at the remainder of her wrist. ? ? ?Review of Systems ? ? ?Objective: ?Vital Signs: BP 132/82 (BP Location: Left Arm, Patient Position: Sitting)   Pulse 69   Ht 4' 11.25" (1.505 m)   Wt 140 lb (63.5 kg)   LMP 09/25/2012   BMI 28.04 kg/m?  ? ?Physical Exam ?Constitutional:   ?   Appearance: Normal appearance.  ?Cardiovascular:  ?   Rate and Rhythm: Normal rate.  ?   Pulses: Normal pulses.  ?Pulmonary:  ?   Effort: Pulmonary effort is normal.  ?Skin: ?   General: Skin is warm and dry.  ?   Capillary Refill: Capillary refill takes less than 2 seconds.  ?Neurological:  ?   Mental Status: She is alert.  ? ? ?Left Hand Exam  ? ?Tenderness  ?Left hand tenderness location: TTP at radial styloid w/ mild swelling.  ? ?Range of Motion  ?The patient has normal left wrist ROM. ? ?Other  ?Erythema:  absent ?Sensation: normal ?Pulse: present ? ?Comments:  Positive Finkelstein test.  Negative [facility] grind test.  No thumb triggering.  No TTP throughout remainder of wrist.  ? ? ? ? ?Specialty Comments:  ?No specialty comments available. ? ?Imaging: ?No results found. ? ? ?PMFS History: ?Patient Active Problem List  ? Diagnosis Date Noted  ? Tenosynovitis, de Quervain 12/25/2021  ? Arthritis of knee, right 12/28/2020  ? Arthritis 05/01/2020  ? Prepatellar bursitis, right knee 05/01/2020  ? Breast lump on right side at 10 o'clock position 04/26/2014  ? Dry eye syndrome 08/31/2012  ? Dysuria 08/31/2012  ? Hypertension 05/14/2012  ? Abdominal pain 05/14/2012  ? ?Past Medical History:   ?Diagnosis Date  ? GERD (gastroesophageal reflux disease)   ? Hypertension 2006  ? Postmenopausal   ?  ?Family History  ?Problem Relation Age of Onset  ? Hypertension Mother   ? Arthritis Mother   ? Hypertension Father   ? Heart disease Father   ?     MI likely cause of death  ? Hypertension Sister   ? Breast cancer Neg Hx   ?  ?Past Surgical History:  ?Procedure Laterality Date  ? CHOLECYSTECTOMY  10/16/2012  ? Procedure: LAPAROSCOPIC CHOLECYSTECTOMY;  Surgeon: Harl Bowie, MD;  Location: [facility];  Service: General;  Laterality: N/A;  ? MASS EXCISION  07/22/2012  ? lipoma on pathology report from R abdomen  ? uterine cyst removed    ? 2006  ? ?Social History  ? ?Occupational History  ? Occupation: Engineer, agricultural  ?  Employer: [employer]  ?  Comment: [employer]  ?Tobacco Use  ? Smoking status: Never  ? Smokeless tobacco: Never  ?Vaping Use  ? Vaping Use: Never used  ?Substance and Sexual Activity  ? Alcohol use: No  ? Drug use: No  ? Sexual activity: Not Currently  ? ? ? ? ? ? ?

## 2021-12-27 ENCOUNTER — Encounter: Payer: Self-pay | Admitting: Internal Medicine

## 2021-12-27 ENCOUNTER — Ambulatory Visit: Payer: Self-pay | Admitting: Internal Medicine

## 2021-12-27 VITALS — BP 110/68 | HR 76 | Resp 12 | Ht 59.25 in | Wt 143.0 lb

## 2021-12-27 DIAGNOSIS — I10 Essential (primary) hypertension: Secondary | ICD-10-CM

## 2021-12-27 DIAGNOSIS — M654 Radial styloid tenosynovitis [de Quervain]: Secondary | ICD-10-CM

## 2021-12-27 NOTE — Progress Notes (Signed)
? ? ?  Subjective:  ?  ?Patient ID: Courtney Randolph, female   DOB: 10/29/1960, 61 y.o.   MRN: [id number] ? ? ?HPI ? ?Courtney Randolph interprets ? ? Left De Quervain's Tenosynovitis:  She has been wearing SPICA  splint, but has already been seen by Dr. Tempie Donning at [facility] with [facility] and underwent injection.  She states she has had good improvement in her pain following the injection on 12/25/21.  Continues to wear the splint as she feels helps with the pain. ? ?2.  Hypertension:  Taking Lisinopril 10 mg regularly.  No chest pain, dyspnea or edema of LE.   ? ?3.  HM:  Has not had Shingrix.  Held with CPE as was receiving other vaccines and wanted to wait.   ? ?Current Meds  ?Medication Sig  ? acetaminophen (TYLENOL) 500 MG tablet Take 500 mg by mouth every 6 (six) hours as needed.  ? lisinopril (ZESTRIL) 10 MG tablet Take 1 tablet by mouth once daily  ? ?No Known Allergies ? ? ?Review of Systems ? ? ? ?Objective:  ? ?BP 110/68 (BP Location: Right Arm, Patient Position: Sitting, Cuff Size: Normal)   Pulse 76   Resp 12   Ht 4' 11.25" (1.505 m)   Wt 143 lb (64.9 kg)   LMP 09/25/2012   BMI 28.64 kg/m?  ? ?Physical Exam ?NAD ?HEENT:  PERRL EOMI ?Neck:  Supple, No adenopathy ?Chest:  CTA ?CV:  RRR without murmur or rub.  Radial pulses normal and equal.  LE without edema and DP pulses normal and equal.   ?Great toenails with mild fungal involvement at distal aspect. ?Wearing spica splint over left wrist. ? ? ? ?Assessment & Plan  ? ? Left De Quervain's Tenosynovitis:  improved with corticosteroid injection.  She will continue to protect with her job in particular using splint.  She will let us know if problem with application for financial assistance. ? ?2.  Hypertension:  controlled.   ? ?3.  HM:  refusing Shingrix vaccine today.   ? ?

## 2022-02-05 ENCOUNTER — Ambulatory Visit: Payer: Self-pay | Admitting: Orthopedic Surgery

## 2022-03-13 ENCOUNTER — Other Ambulatory Visit: Payer: Self-pay

## 2022-03-13 MED ORDER — LISINOPRIL 10 MG PO TABS: 10.0000 mg | ORAL_TABLET | Freq: Every day | ORAL | 2 refills | Status: DC

## 2022-06-27 ENCOUNTER — Other Ambulatory Visit: Payer: Self-pay

## 2022-06-27 DIAGNOSIS — Z0000 Encounter for general adult medical examination without abnormal findings: Secondary | ICD-10-CM

## 2022-06-28 LAB — CBC WITH DIFFERENTIAL/PLATELET
Basophils Absolute: 0.1 10*3/uL (ref 0.0–0.2)
Basos: 1 %
EOS (ABSOLUTE): 0.1 10*3/uL (ref 0.0–0.4)
Eos: 1 %
Hematocrit: 45.3 % (ref 34.0–46.6)
Hemoglobin: 15.2 g/dL (ref 11.1–15.9)
Immature Grans (Abs): 0 10*3/uL (ref 0.0–0.1)
Immature Granulocytes: 0 %
Lymphocytes Absolute: 1.6 10*3/uL (ref 0.7–3.1)
Lymphs: 22 %
MCH: 28.5 pg (ref 26.6–33.0)
MCHC: 33.6 g/dL (ref 31.5–35.7)
MCV: 85 fL (ref 79–97)
Monocytes Absolute: 0.4 10*3/uL (ref 0.1–0.9)
Monocytes: 6 %
Neutrophils Absolute: 4.9 10*3/uL (ref 1.4–7.0)
Neutrophils: 70 %
Platelets: 252 10*3/uL (ref 150–450)
RBC: 5.34 x10E6/uL — ABNORMAL HIGH (ref 3.77–5.28)
RDW: 13.3 % (ref 11.7–15.4)
WBC: 7.1 10*3/uL (ref 3.4–10.8)

## 2022-06-28 LAB — HEMOGLOBIN A1C
Est. average glucose Bld gHb Est-mCnc: 114 mg/dL
Hgb A1c MFr Bld: 5.6 % (ref 4.8–5.6)

## 2022-06-28 LAB — HEPATITIS C ANTIBODY: Hep C Virus Ab: NONREACTIVE

## 2022-06-28 LAB — COMPREHENSIVE METABOLIC PANEL
ALT: 15 IU/L (ref 0–32)
AST: 15 IU/L (ref 0–40)
Albumin/Globulin Ratio: 2 (ref 1.2–2.2)
Albumin: 4.5 g/dL (ref 3.8–4.9)
Alkaline Phosphatase: 106 IU/L (ref 44–121)
BUN/Creatinine Ratio: 18 (ref 12–28)
BUN: 13 mg/dL (ref 8–27)
Bilirubin Total: 0.8 mg/dL (ref 0.0–1.2)
CO2: 24 mmol/L (ref 20–29)
Calcium: 9.3 mg/dL (ref 8.7–10.3)
Chloride: 102 mmol/L (ref 96–106)
Creatinine, Ser: 0.72 mg/dL (ref 0.57–1.00)
Globulin, Total: 2.2 g/dL (ref 1.5–4.5)
Glucose: 113 mg/dL — ABNORMAL HIGH (ref 70–99)
Potassium: 4.2 mmol/L (ref 3.5–5.2)
Sodium: 139 mmol/L (ref 134–144)
Total Protein: 6.7 g/dL (ref 6.0–8.5)
eGFR: 96 mL/min/{1.73_m2} (ref >59)

## 2022-06-28 LAB — LIPID PANEL W/O CHOL/HDL RATIO
Cholesterol, Total: 222 mg/dL — ABNORMAL HIGH (ref 100–199)
HDL: 41 mg/dL (ref >39)
LDL Chol Calc (NIH): 153 mg/dL — ABNORMAL HIGH (ref 0–99)
Triglycerides: 156 mg/dL — ABNORMAL HIGH (ref 0–149)
VLDL Cholesterol Cal: 28 mg/dL (ref 5–40)

## 2022-06-28 LAB — HIV ANTIBODY (ROUTINE TESTING W REFLEX): HIV Screen 4th Generation wRfx: NONREACTIVE

## 2022-07-04 ENCOUNTER — Encounter: Payer: Self-pay | Admitting: Internal Medicine

## 2022-07-06 IMAGING — MG MM DIGITAL SCREENING BILAT W/ TOMO AND CAD
8 series · 8 of 24 positions shown · non-contrast
Comparison: Previous exam(s).

CLINICAL DATA: Screening.

EXAM:
DIGITAL SCREENING BILATERAL MAMMOGRAM WITH TOMOSYNTHESIS AND CAD
TECHNIQUE: Bilateral screening digital craniocaudal and mediolateral oblique
mammograms were obtained. Bilateral screening digital breast
tomosynthesis was performed. The images were evaluated with
computer-aided detection.

[R CC synth-2D]
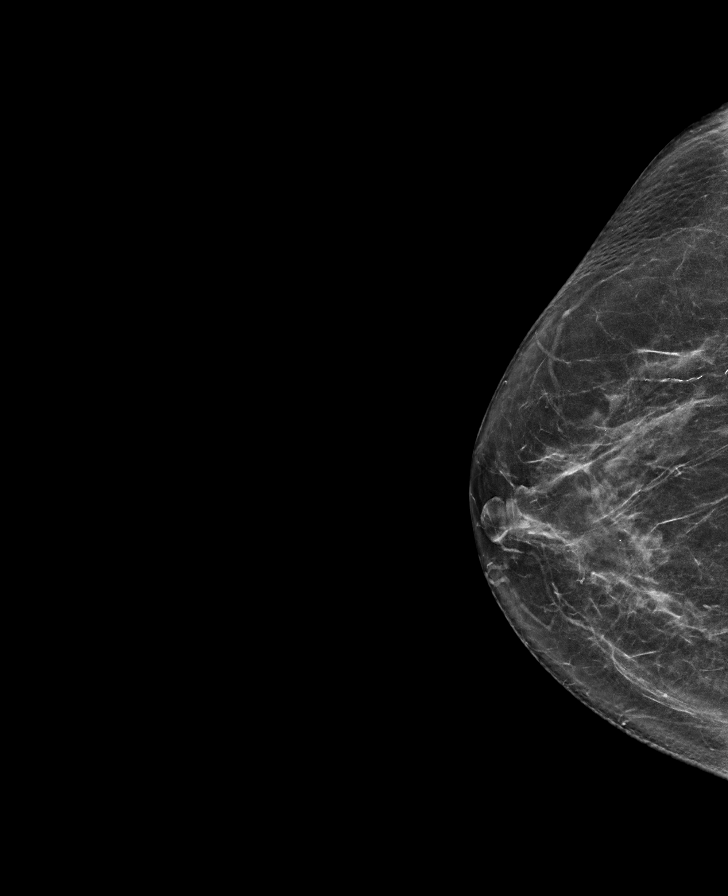

[R MLO synth-2D]
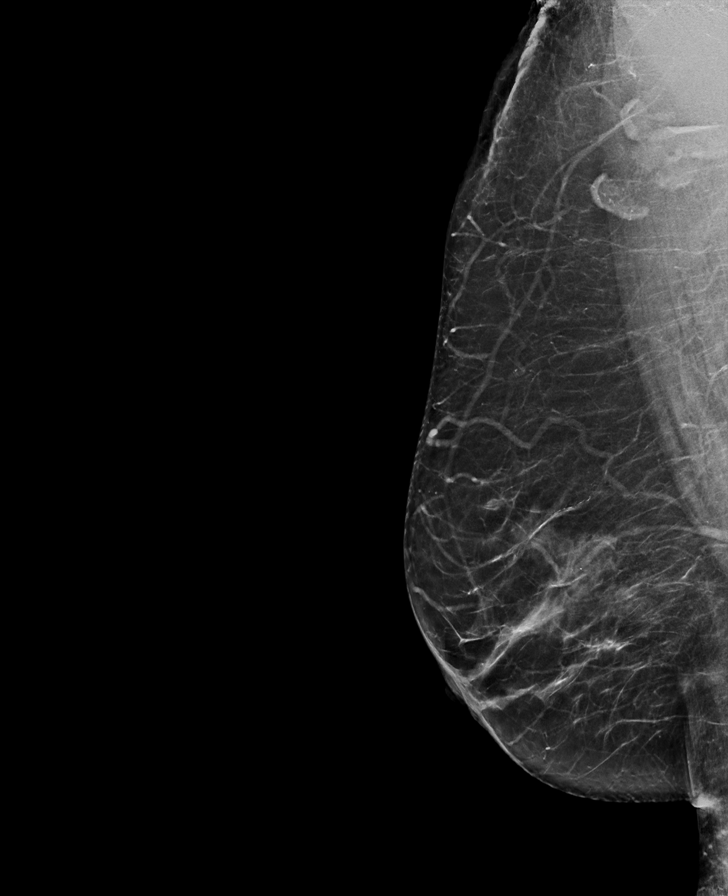

[L CC synth-2D]
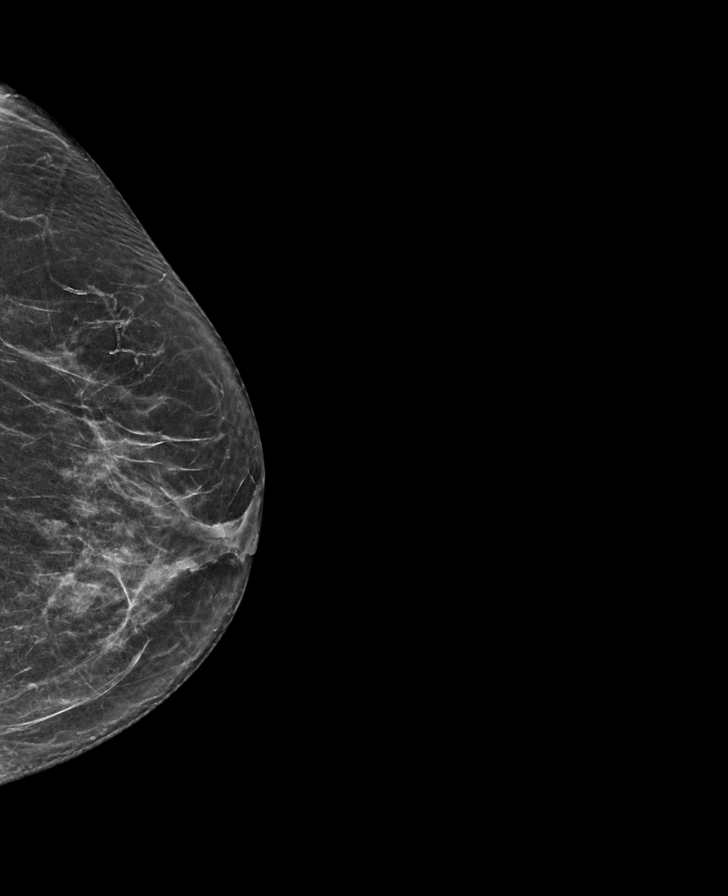

[L MLO synth-2D]
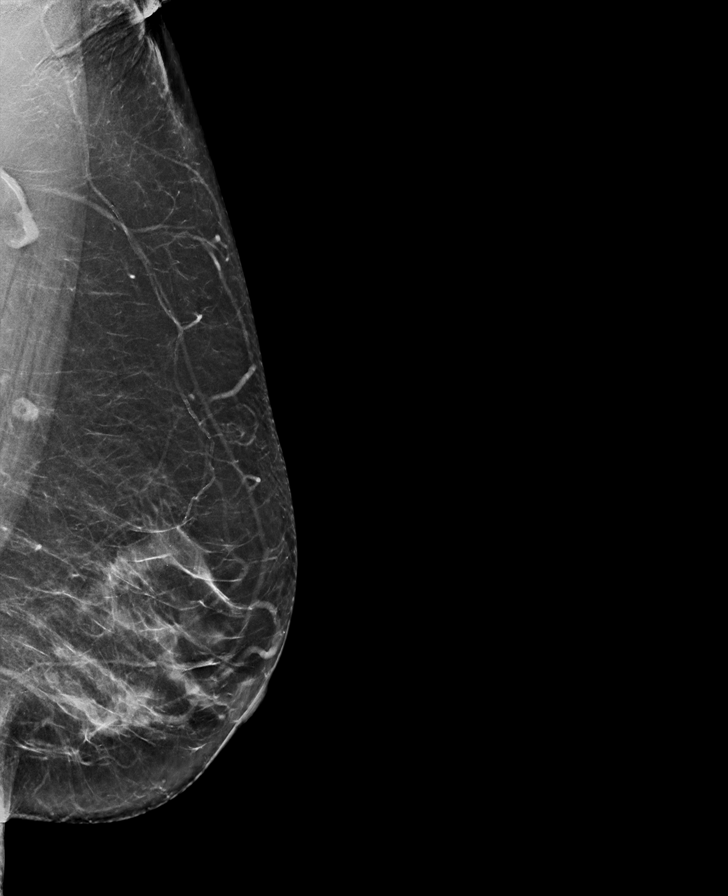

[R MLO tomo · tomo slice 35/68.0]
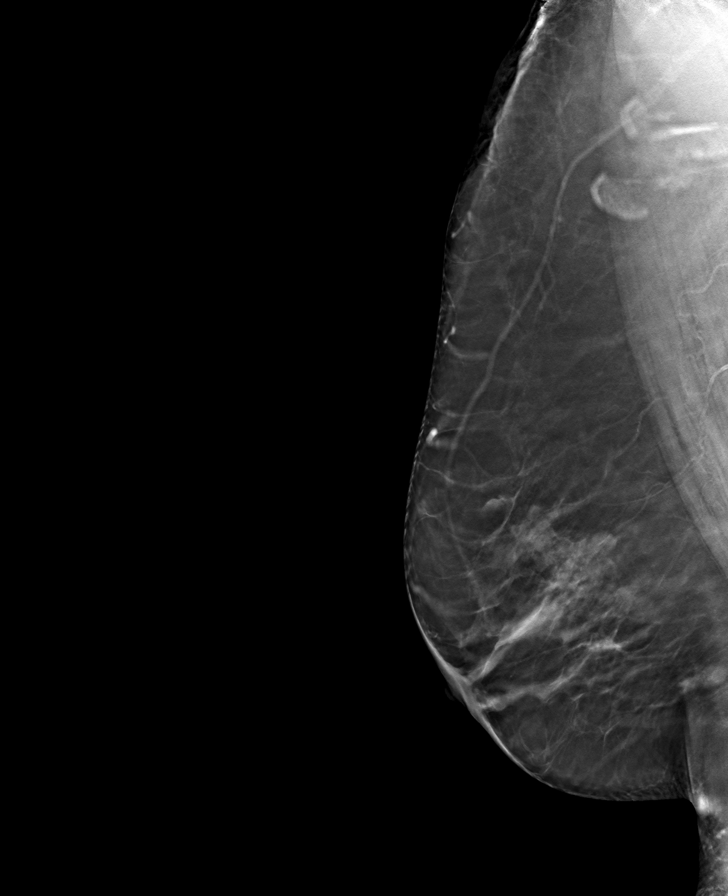

[L CC tomo · tomo slice 33/65.0]
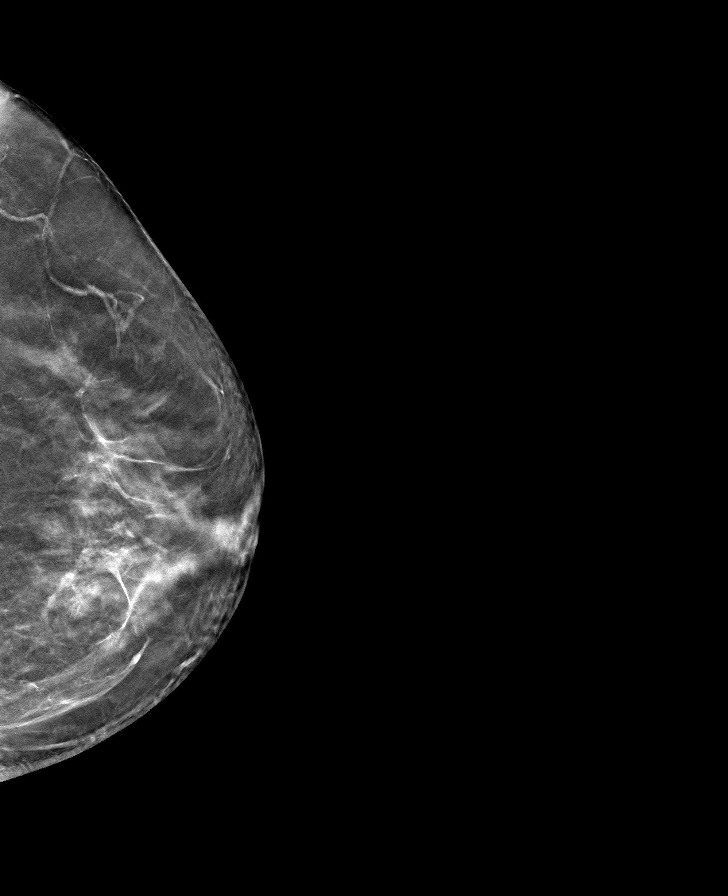

[L MLO tomo · tomo slice 37/72.0]
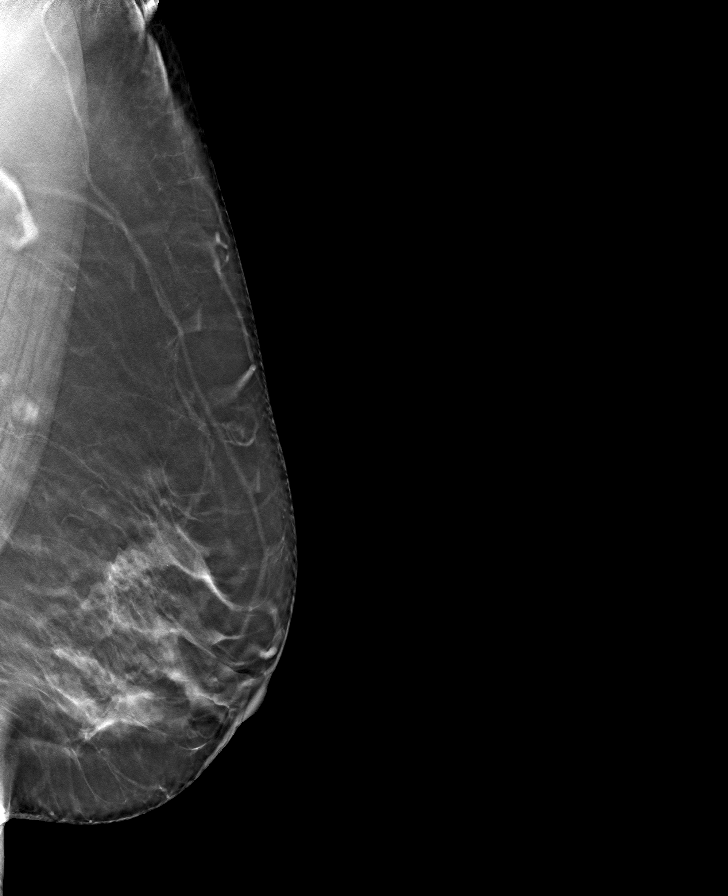

[R CC tomo · tomo slice 33/65.0]
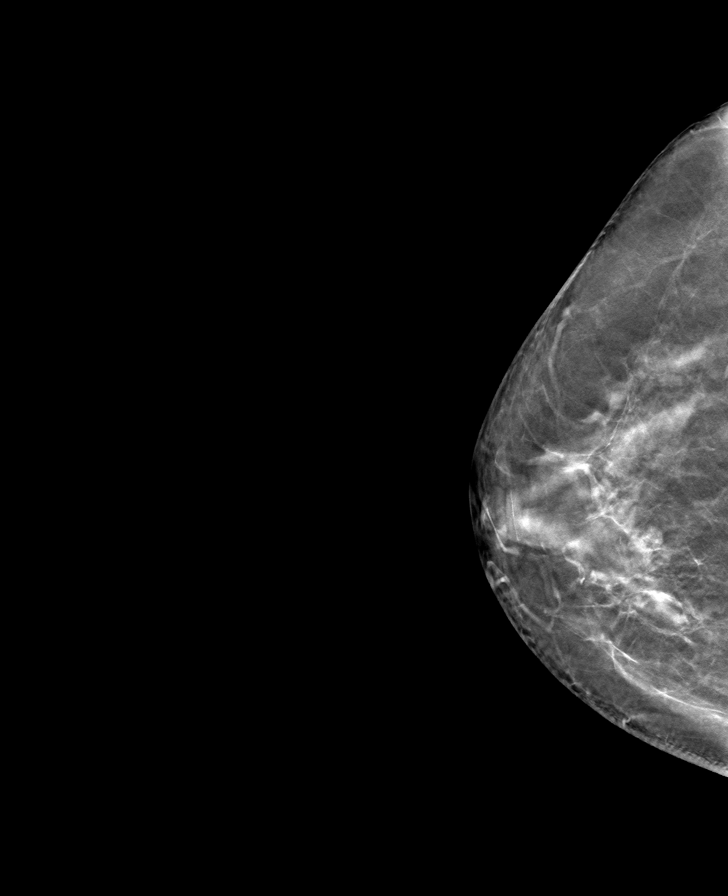

[8 of 24 positions shown; findings below may reference images not displayed]

ACR Breast Density Category b: There are scattered areas of
fibroglandular density.
FINDINGS: There are no findings suspicious for malignancy.
IMPRESSION: No mammographic evidence of malignancy. A result letter of this
screening mammogram will be mailed directly to the patient.

RECOMMENDATION:
Screening mammogram in one year. (Code:51-O-LD2)

BI-RADS CATEGORY  1: Negative.

## 2022-07-11 ENCOUNTER — Ambulatory Visit: Payer: Self-pay | Admitting: Internal Medicine

## 2022-07-11 ENCOUNTER — Encounter: Payer: Self-pay | Admitting: Internal Medicine

## 2022-07-11 VITALS — BP 112/70 | HR 76 | Resp 12 | Ht 59.0 in | Wt 143.0 lb

## 2022-07-11 DIAGNOSIS — Z5941 Food insecurity: Secondary | ICD-10-CM

## 2022-07-11 DIAGNOSIS — I10 Essential (primary) hypertension: Secondary | ICD-10-CM

## 2022-07-11 DIAGNOSIS — Z1231 Encounter for screening mammogram for malignant neoplasm of breast: Secondary | ICD-10-CM

## 2022-07-11 DIAGNOSIS — Z0000 Encounter for general adult medical examination without abnormal findings: Secondary | ICD-10-CM

## 2022-07-11 NOTE — Patient Instructions (Signed)

## 2022-07-11 NOTE — Progress Notes (Signed)
Subjective:    Patient ID: Courtney Randolph, female   DOB: 1961/09/30, 61 y.o.   MRN: [id number]   HPI  CPE without pap  1.  Pap: Last 11/2019 and normal.   2.  Mammogram:  Last 07/2021 and normal.  No family history of breast cancer.   3.  Osteoprevention:  soy milk 3-4 servings daily. Walks on days off--2 days weekly.  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    4.  Guaiac Cards/FIT:  Last 06/2021 and negative.  No history of anemia.    5.  Colonoscopy:  Never.  No family history of colon cancer.    6.  Immunizations:  Has not had influenza vaccine--feels she gets ill when she obtains.  Has not had COVID vaccine.  Has pain in her left shoulder since 2nd COVID vaccination.  Comes and goes.  No history of shingles vaccination. Immunization History  Administered Date(s) Administered   Influenza Split 06/19/2012   PFIZER(Purple Top)SARS-COV-2 Vaccination 02/01/2020, 02/22/2020   Pfizer Covid-19 Vaccine Bivalent Booster 21yr & up 06/28/2021   Tdap 06/28/2021       7.  Glucose/Cholesterol:  History of prediabetes, but A1C down to normal range at 5/6% past 2 years.   Cholesterol up this year with elevated  LDL, trigs and low HDL. Lipid Panel     Component Value Date/Time   CHOL 222 (H) 06/27/2022 0913   TRIG 156 (H) 06/27/2022 0913   HDL 41 06/27/2022 0913   LDLCALC 153 (H) 06/27/2022 0913   LABVLDL 28 06/27/2022 0913     Current Meds  Medication Sig   acetaminophen (TYLENOL) 500 MG tablet Take 500 mg by mouth every 6 (six) hours as needed.   lisinopril (ZESTRIL) 10 MG tablet Take 1 tablet (10 mg total) by mouth daily.   No Known Allergies  Past Medical History:  Diagnosis Date   GERD (gastroesophageal reflux disease)    Hypertension 2006   Postmenopausal    Past Surgical History:  Procedure Laterality Date   CHOLECYSTECTOMY  10/16/2012   Procedure: LAPAROSCOPIC CHOLECYSTECTOMY;  Surgeon: DHarl Bowie MD;  [place] [facility] [facility]  Service:  General;  Laterality: N/A;   MASS EXCISION  07/22/2012   lipoma on pathology report from R abdomen   uterine cyst removed     2006   Family History  Problem Relation Age of Onset   Hypertension Mother    Arthritis Mother        Describes rheumatoid arthritis   Hypertension Father    Heart disease Father        MI likely cause of death   Hypertension Sister    Breast cancer Neg Hx    Social History   Socioeconomic History   Marital status: Single    Spouse name: Not on file   Number of children: 4   Years of education: Not on file   Highest education level: 1st grade  Occupational History   Occupation: HActuaryand dEstate manager/land agent CFutures trader   Comment: CTherapist, nutritional Tobacco Use   Smoking status: Never    Passive exposure: Never   Smokeless tobacco: Never  Vaping Use   Vaping Use: Never used  Substance and Sexual Activity   Alcohol use: No   Drug use: No   Sexual activity: Not Currently  Other Topics Concern   Not on file  Social History Narrative   [place]  Works at [employer] and in housekeeping at [employer].   Originally from [place]   Social Determinants of Health   Financial Resource Strain: Medium Risk (06/28/2021)   Overall Financial Resource Strain (CARDIA)    Difficulty of Paying Living Expenses: Somewhat hard  Food Insecurity: Food Insecurity Present (07/11/2022)   Hunger Vital Sign    Worried About Running Out of Food in the Last Year: Sometimes true    Ran Out of Food in the Last Year: Sometimes true  Transportation Needs: No Transportation Needs (07/11/2022)   PRAPARE - Hydrologist (Medical): No    Lack of Transportation (Non-Medical): No  Physical Activity: Not on file  Stress: No Stress Concern Present (02/24/2020)   [place]    Feeling of Stress : Only a little  Social Connections: Unknown  (06/28/2021)   Social Connection and Isolation Panel [NHANES]    Frequency of Communication with Friends and Family: Not on file    Frequency of Social Gatherings with Friends and Family: Not on file    Attends Religious Services: Not on file    Active Member of Clubs or Organizations: No    Attends Archivist Meetings: Not on file    Marital Status: Not on file  Intimate Partner Violence: Not At Risk (07/11/2022)   Humiliation, Afraid, Rape, and Kick questionnaire    Fear of Current or Ex-Partner: No    Emotionally Abused: No    Physically Abused: No    Sexually Abused: No      Review of Systems  HENT:  Positive for dental problem (Teeth sensitive.).   Respiratory:  Negative for shortness of breath.   Cardiovascular:  Negative for chest pain, palpitations and leg swelling.      Objective:   BP 112/70 (BP Location: Left Arm, Patient Position: Sitting, Cuff Size: Normal)   Pulse 76   Resp 12   Ht '4\' 11"'$  (1.499 m)   Wt 143 lb (64.9 kg)   LMP 09/25/2012   BMI 28.88 kg/m   Physical Exam HENT:     Head: Normocephalic and atraumatic.     Right Ear: Tympanic membrane, ear canal and external ear normal.     Left Ear: Tympanic membrane, ear canal and external ear normal.     Nose: Nose normal.     Mouth/Throat:     Mouth: Mucous membranes are moist.     Pharynx: Oropharynx is clear.  Eyes:     Extraocular Movements: Extraocular movements intact.     Conjunctiva/sclera: Conjunctivae normal.     Pupils: Pupils are equal, round, and reactive to light.     Comments: Discs sharp  Neck:     Thyroid: No thyroid mass or thyromegaly.  Cardiovascular:     Rate and Rhythm: Normal rate and regular rhythm.     Heart sounds: S1 normal and S2 normal. No murmur heard.    No friction rub. No S3 or S4 sounds.     Comments: No carotid bruits.  Carotid, radial, femoral, DP and PT pulses normal and equal.    Pulmonary:     Effort: Pulmonary effort is normal.     Breath sounds:  Normal breath sounds.  Chest:  Breasts:    Right: No inverted nipple, mass or nipple discharge.     Left: No inverted nipple, mass or nipple discharge.  Abdominal:     General: Bowel sounds are normal.  Palpations: Abdomen is soft. There is no hepatomegaly, splenomegaly or mass.     Tenderness: There is no abdominal tenderness.     Hernia: No hernia is present.  Genitourinary:    Comments: Patient declined pelvic exam. Musculoskeletal:        General: Normal range of motion.     Cervical back: Normal range of motion and neck supple.     Right lower leg: No edema.     Left lower leg: No edema.  Lymphadenopathy:     Head:     Right side of head: No submental or submandibular adenopathy.     Left side of head: No submental or submandibular adenopathy.     Cervical: No cervical adenopathy.     Upper Body:     Right upper body: No supraclavicular or axillary adenopathy.     Left upper body: No supraclavicular or axillary adenopathy.     Lower Body: No right inguinal adenopathy. No left inguinal adenopathy.  Skin:    General: Skin is warm.     Capillary Refill: Capillary refill takes less than 2 seconds.     Findings: No rash.  Neurological:     General: No focal deficit present.     Mental Status: She is alert and oriented to person, place, and time.     Cranial Nerves: Cranial nerves 2-12 are intact.     Sensory: Sensation is intact.     Motor: Motor function is intact.     Coordination: Coordination is intact.     Gait: Gait is intact.     Deep Tendon Reflexes: Reflexes are normal and symmetric.  Psychiatric:        Attention and Perception: Attention normal.        Speech: Speech normal.        Behavior: Behavior normal. Behavior is cooperative.      Assessment & Plan    CPE without pap or pelvic (declined latter) Mammogram  Refused Shingrix, influenza and COVID vaccines. FIT to return in 2 weeks.  2.  Hypertension:  controlled  3.  Tooth sensitivity:  Needs  orange card --see [facility] referral below.    4.  Food access and rent as well as history she does not qualify for orange card:  Referral to work with Leeann Must.  Her financial barriers do not square with not qualifying for orange card.

## 2022-07-25 ENCOUNTER — Other Ambulatory Visit: Payer: Self-pay

## 2022-07-25 DIAGNOSIS — Z1211 Encounter for screening for malignant neoplasm of colon: Secondary | ICD-10-CM

## 2022-07-25 LAB — POC FIT TEST STOOL: Fecal Occult Blood: NEGATIVE

## 2022-11-05 ENCOUNTER — Ambulatory Visit
Admission: RE | Admit: 2022-11-05 | Discharge: 2022-11-05 | Disposition: A | Discharge status: Home / Self Care | Payer: No Typology Code available for payment source | Source: Ambulatory Visit | Attending: Internal Medicine | Admitting: Internal Medicine

## 2022-11-05 DIAGNOSIS — Z1231 Encounter for screening mammogram for malignant neoplasm of breast: Secondary | ICD-10-CM

## 2022-12-03 ENCOUNTER — Other Ambulatory Visit: Payer: Self-pay | Admitting: Internal Medicine

## 2023-01-09 ENCOUNTER — Ambulatory Visit: Payer: Self-pay | Admitting: Internal Medicine

## 2023-01-09 ENCOUNTER — Encounter: Payer: Self-pay | Admitting: Internal Medicine

## 2023-01-09 VITALS — BP 116/78 | HR 72 | Resp 16 | Ht 59.0 in | Wt 140.0 lb

## 2023-01-09 DIAGNOSIS — M7581 Other shoulder lesions, right shoulder: Secondary | ICD-10-CM

## 2023-01-09 DIAGNOSIS — G5601 Carpal tunnel syndrome, right upper limb: Secondary | ICD-10-CM

## 2023-01-09 DIAGNOSIS — G5602 Carpal tunnel syndrome, left upper limb: Secondary | ICD-10-CM

## 2023-01-09 DIAGNOSIS — I10 Essential (primary) hypertension: Secondary | ICD-10-CM

## 2023-01-09 DIAGNOSIS — M778 Other enthesopathies, not elsewhere classified: Secondary | ICD-10-CM

## 2023-01-09 MED ORDER — IBUPROFEN 200 MG PO TABS: ORAL_TABLET | ORAL | 0 refills | Status: AC

## 2023-01-09 NOTE — Progress Notes (Signed)
    Subjective:    Patient ID: Courtney Randolph, female   DOB: 1961/06/15, 62 y.o.   MRN: [id number]   HPI   Dental concerns:  States did not meet criteria for orange card, so has not been referred to dental clinic.     2.  Hypertension:  Tolerating Lisinopril  fine.    3.  Arm swelling:  Pain in right arm started 3 weeks ago.  Points to her medial antecubital area and states she has a bump there but has pain up to belly of bicep.  She also is having throbbing pain from her lateral shoulder/deltoid and radiates down to her fingertips.  Blames this on her second COVID vaccine.  When asked if she has had the pain since 01/2020, when she received the 2nd vaccine, she states the pain started about 1 week later.   Ultimately, the pain after the vaccine went away.  She is of the belief the pain she is having now in the arm is also due to the vaccine.   She cannot recall an injury prior to the pain starting. 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  There is nothing with her work that seems to make the arm hurt worse.   She does have numbness and tingling in her fingers of left hand--generally when she is in bed and her arm hurts more.  Has a problem finding a place of comfort and then fingers go numb.   Numbness wakes her from sleep and able to shake the hand out and get feeling back. Ibuprofen  200 mg has helped with arm pain.    Current Meds  Medication Sig   acetaminophen  (TYLENOL ) 500 MG tablet Take 500 mg by mouth every 6 (six) hours as needed.   lisinopril  (ZESTRIL ) 10 MG tablet Take 1 tablet by mouth once daily   No Known Allergies   Review of Systems    Objective:   BP 116/78 (BP Location: Left Arm, Patient Position: Sitting, Cuff Size: Normal)   Pulse 72   Resp 16   Ht 4' 11 (1.499 m)   Wt 140 lb (63.5 kg)   LMP 09/25/2012   BMI 28.28 kg/m   Physical Exam NAD Lungs:  CTA CV:  RRR without murmur or rub.  Radial and DP pulses  normal and equal.  No LE edema. Upper extremities:   Lipoma medial epicondylar area bilaterally, L greater than right. Full ROM of right shoulder. Point tenderness and pain at insertion of deltoid on proximal humerus.   Increased discomfort and decreased resistance with downward force on abducted, internally rotated shoulder/arm No subacromial bursa tenderness.   + Tinels and Phalens over left median nerve at volar wrist.   Assessment & Plan   Hypertension:  controlled  2.  Right shoulder and upper arm pain:  appears to have a couple areas of tendinitis, including rotator cuff.  Referral to PT.  Ibuprofen  200 mg twice daily for 14 days with mels  3.  Left Carpal Tunnel Syndrome:  Cock up splint at night.  Cool bedroom temp.  Ibuprofen  as above.

## 2023-01-09 NOTE — Patient Instructions (Signed)
Cock up splint every night 65-68 degrees in bedroom.

## 2023-03-03 ENCOUNTER — Other Ambulatory Visit: Payer: Self-pay | Admitting: Internal Medicine

## 2023-05-13 ENCOUNTER — Ambulatory Visit: Payer: Self-pay | Admitting: Internal Medicine

## 2023-05-13 ENCOUNTER — Encounter: Payer: Self-pay | Admitting: Internal Medicine

## 2023-05-13 VITALS — BP 134/70 | HR 71 | Resp 16 | Ht 59.25 in | Wt 139.0 lb

## 2023-05-13 DIAGNOSIS — I10 Essential (primary) hypertension: Secondary | ICD-10-CM

## 2023-05-13 DIAGNOSIS — G5602 Carpal tunnel syndrome, left upper limb: Secondary | ICD-10-CM | POA: Insufficient documentation

## 2023-05-13 DIAGNOSIS — M778 Other enthesopathies, not elsewhere classified: Secondary | ICD-10-CM

## 2023-05-13 DIAGNOSIS — M7581 Other shoulder lesions, right shoulder: Secondary | ICD-10-CM | POA: Insufficient documentation
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
## 2023-07-15 ENCOUNTER — Encounter: Payer: Self-pay | Admitting: Internal Medicine

## 2023-07-15 ENCOUNTER — Ambulatory Visit: Payer: Self-pay | Admitting: Internal Medicine

## 2023-07-15 ENCOUNTER — Other Ambulatory Visit: Payer: Self-pay | Admitting: Internal Medicine

## 2023-07-15 VITALS — BP 148/80 | HR 60 | Resp 16 | Ht 59.0 in | Wt 139.0 lb

## 2023-07-15 DIAGNOSIS — Z124 Encounter for screening for malignant neoplasm of cervix: Secondary | ICD-10-CM

## 2023-07-15 DIAGNOSIS — Z5948 Other specified lack of adequate food: Secondary | ICD-10-CM

## 2023-07-15 DIAGNOSIS — I10 Essential (primary) hypertension: Secondary | ICD-10-CM

## 2023-07-15 DIAGNOSIS — Z0000 Encounter for general adult medical examination without abnormal findings: Secondary | ICD-10-CM

## 2023-07-15 DIAGNOSIS — R7303 Prediabetes: Secondary | ICD-10-CM

## 2023-07-15 DIAGNOSIS — E7800 Pure hypercholesterolemia, unspecified: Secondary | ICD-10-CM

## 2023-07-15 DIAGNOSIS — Z23 Encounter for immunization: Secondary | ICD-10-CM
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
## 2023-07-16 LAB — COMPREHENSIVE METABOLIC PANEL
ALT: 20 [IU]/L (ref 0–32)
AST: 17 [IU]/L (ref 0–40)
Albumin: 4.3 g/dL (ref 3.9–4.9)
Alkaline Phosphatase: 102 [IU]/L (ref 44–121)
BUN/Creatinine Ratio: 15 (ref 12–28)
BUN: 10 mg/dL (ref 8–27)
Bilirubin Total: 0.5 mg/dL (ref 0.0–1.2)
CO2: 26 mmol/L (ref 20–29)
Calcium: 9.2 mg/dL (ref 8.7–10.3)
Chloride: 103 mmol/L (ref 96–106)
Creatinine, Ser: 0.67 mg/dL (ref 0.57–1.00)
Globulin, Total: 2.6 g/dL (ref 1.5–4.5)
Glucose: 89 mg/dL (ref 70–99)
Potassium: 4 mmol/L (ref 3.5–5.2)
Sodium: 142 mmol/L (ref 134–144)
Total Protein: 6.9 g/dL (ref 6.0–8.5)
eGFR: 99 mL/min/{1.73_m2} (ref >59)

## 2023-07-16 LAB — CBC WITH DIFFERENTIAL/PLATELET
Basophils Absolute: 0.1 10*3/uL (ref 0.0–0.2)
Basos: 1 %
EOS (ABSOLUTE): 0.1 10*3/uL (ref 0.0–0.4)
Eos: 1 %
Hematocrit: 43.7 % (ref 34.0–46.6)
Hemoglobin: 14.3 g/dL (ref 11.1–15.9)
Immature Grans (Abs): 0 10*3/uL (ref 0.0–0.1)
Immature Granulocytes: 0 %
Lymphocytes Absolute: 2.5 10*3/uL (ref 0.7–3.1)
Lymphs: 25 %
MCH: 28.1 pg (ref 26.6–33.0)
MCHC: 32.7 g/dL (ref 31.5–35.7)
MCV: 86 fL (ref 79–97)
Monocytes Absolute: 0.5 10*3/uL (ref 0.1–0.9)
Monocytes: 5 %
Neutrophils Absolute: 6.6 10*3/uL (ref 1.4–7.0)
Neutrophils: 68 %
Platelets: 289 10*3/uL (ref 150–450)
RBC: 5.09 x10E6/uL (ref 3.77–5.28)
RDW: 13.3 % (ref 11.7–15.4)
WBC: 9.8 10*3/uL (ref 3.4–10.8)

## 2023-07-16 LAB — LIPID PANEL W/O CHOL/HDL RATIO
Cholesterol, Total: 209 mg/dL — ABNORMAL HIGH (ref 100–199)
HDL: 43 mg/dL (ref >39)
LDL Chol Calc (NIH): 138 mg/dL — ABNORMAL HIGH (ref 0–99)
Triglycerides: 154 mg/dL — ABNORMAL HIGH (ref 0–149)
VLDL Cholesterol Cal: 28 mg/dL (ref 5–40)

## 2023-07-16 LAB — HGB A1C W/O EAG: Hgb A1c MFr Bld: 5.8 % — ABNORMAL HIGH (ref 4.8–5.6)

## 2023-07-16 LAB — CYTOLOGY - PAP

## 2023-07-21 ENCOUNTER — Other Ambulatory Visit: Payer: Self-pay

## 2023-07-21 DIAGNOSIS — Z1211 Encounter for screening for malignant neoplasm of colon: Secondary | ICD-10-CM

## 2023-07-23 LAB — POC FIT TEST STOOL: Fecal Occult Blood: NEGATIVE

## 2023-08-27 ENCOUNTER — Other Ambulatory Visit: Payer: Self-pay

## 2023-08-27 ENCOUNTER — Telehealth: Payer: Self-pay

## 2023-08-27 MED ORDER — LISINOPRIL 10 MG PO TABS: 10.0000 mg | ORAL_TABLET | Freq: Every day | ORAL | 0 refills | Status: AC

## 2023-08-27 NOTE — Telephone Encounter (Signed)
Sent 1 month Rx of lisinopril to [facility]. Patients current pharmacy [facility] on [address], does not have generic lisinopril available and brand it expensive.

## 2024-01-12 DIAGNOSIS — E7800 Pure hypercholesterolemia, unspecified: Secondary | ICD-10-CM | POA: Insufficient documentation

## 2024-01-12 DIAGNOSIS — Z5948 Other specified lack of adequate food: Secondary | ICD-10-CM | POA: Insufficient documentation

## 2024-01-13 ENCOUNTER — Ambulatory Visit: Payer: Self-pay | Admitting: Internal Medicine

## 2024-07-16 ENCOUNTER — Other Ambulatory Visit: Payer: Self-pay

## 2024-07-16 DIAGNOSIS — Z0000 Encounter for general adult medical examination without abnormal findings: Secondary | ICD-10-CM

## 2024-07-17 LAB — CBC WITH DIFFERENTIAL/PLATELET
Basophils Absolute: 0.1 x10E3/uL (ref 0.0–0.2)
Basos: 1 %
EOS (ABSOLUTE): 0.1 x10E3/uL (ref 0.0–0.4)
Eos: 2 %
Hematocrit: 45.2 % (ref 34.0–46.6)
Hemoglobin: 14.5 g/dL (ref 11.1–15.9)
Immature Grans (Abs): 0 x10E3/uL (ref 0.0–0.1)
Immature Granulocytes: 0 %
Lymphocytes Absolute: 1.7 x10E3/uL (ref 0.7–3.1)
Lymphs: 22 %
MCH: 28.2 pg (ref 26.6–33.0)
MCHC: 32.1 g/dL (ref 31.5–35.7)
MCV: 88 fL (ref 79–97)
Monocytes Absolute: 0.5 x10E3/uL (ref 0.1–0.9)
Monocytes: 6 %
Neutrophils Absolute: 5.4 x10E3/uL (ref 1.4–7.0)
Neutrophils: 69 %
Platelets: 265 x10E3/uL (ref 150–450)
RBC: 5.15 x10E6/uL (ref 3.77–5.28)
RDW: 13.4 % (ref 11.7–15.4)
WBC: 7.8 x10E3/uL (ref 3.4–10.8)

## 2024-07-17 LAB — LIPID PANEL W/O CHOL/HDL RATIO
Cholesterol, Total: 218 mg/dL — ABNORMAL HIGH (ref 100–199)
HDL: 43 mg/dL (ref >39)
LDL Chol Calc (NIH): 151 mg/dL — ABNORMAL HIGH (ref 0–99)
Triglycerides: 135 mg/dL (ref 0–149)
VLDL Cholesterol Cal: 24 mg/dL (ref 5–40)

## 2024-07-17 LAB — HEMOGLOBIN A1C
Est. average glucose Bld gHb Est-mCnc: 120 mg/dL
Hgb A1c MFr Bld: 5.8 % — ABNORMAL HIGH (ref 4.8–5.6)

## 2024-07-17 LAB — COMPREHENSIVE METABOLIC PANEL WITH GFR
ALT: 21 IU/L (ref 0–32)
AST: 17 IU/L (ref 0–40)
Albumin: 4.2 g/dL (ref 3.9–4.9)
Alkaline Phosphatase: 115 IU/L (ref 49–135)
BUN/Creatinine Ratio: 17 (ref 12–28)
BUN: 11 mg/dL (ref 8–27)
Bilirubin Total: 0.7 mg/dL (ref 0.0–1.2)
CO2: 25 mmol/L (ref 20–29)
Calcium: 8.9 mg/dL (ref 8.7–10.3)
Chloride: 104 mmol/L (ref 96–106)
Creatinine, Ser: 0.66 mg/dL (ref 0.57–1.00)
Globulin, Total: 2.6 g/dL (ref 1.5–4.5)
Glucose: 109 mg/dL — ABNORMAL HIGH (ref 70–99)
Potassium: 3.9 mmol/L (ref 3.5–5.2)
Sodium: 140 mmol/L (ref 134–144)
Total Protein: 6.8 g/dL (ref 6.0–8.5)
eGFR: 99 mL/min/1.73 (ref >59)

## 2024-07-20 ENCOUNTER — Ambulatory Visit: Payer: Self-pay | Admitting: Internal Medicine

## 2024-07-20 ENCOUNTER — Encounter: Payer: Self-pay | Admitting: Internal Medicine

## 2024-07-20 VITALS — BP 124/78 | HR 72 | Resp 20 | Ht 59.25 in | Wt 145.0 lb

## 2024-07-20 DIAGNOSIS — F341 Dysthymic disorder: Secondary | ICD-10-CM

## 2024-07-20 DIAGNOSIS — Z1231 Encounter for screening mammogram for malignant neoplasm of breast: Secondary | ICD-10-CM

## 2024-07-20 DIAGNOSIS — Z23 Encounter for immunization: Secondary | ICD-10-CM

## 2024-07-20 DIAGNOSIS — Z0000 Encounter for general adult medical examination without abnormal findings: Secondary | ICD-10-CM

## 2024-07-20 DIAGNOSIS — B349 Viral infection, unspecified: Secondary | ICD-10-CM

## 2024-07-20 DIAGNOSIS — H1131 Conjunctival hemorrhage, right eye: Secondary | ICD-10-CM

## 2024-07-20 DIAGNOSIS — Z599 Problem related to housing and economic circumstances, unspecified: Secondary | ICD-10-CM

## 2024-07-20 NOTE — Progress Notes (Signed)
 "   Subjective:    Patient ID: Courtney Randolph, female   DOB: 09/26/1961, 63 y.o.   MRN: [id number]   HPI  CPE without pap  1.  Pap:  Last 07/2023 and normal.    2.  Mammogram:  Last 10/2022 and normal  3.  Osteoprevention:  Has 2-3 servings of almond milk daily.  Walks outside around her home every 2-3 days.  30 minutes each time.    4.  Guaiac Cards/FIT:  Negative last 07/2023.    5.  Colonoscopy:  Never.  No family history of colon cancer.    6.  Immunizations:  Needs second shingrix , influenza, COVID and Pneumococcal 20.  She is not interested in any vaccines today. Immunization History  Administered Date(s) Administered   Influenza Split 06/19/2012   Influenza, Mdck, Trivalent,PF 6+ MOS(egg free) 07/15/2023   PFIZER(Purple Top)SARS-COV-2 Vaccination 02/01/2020, 02/22/2020   Pfizer Covid-19 Vaccine Bivalent Booster 43yrs & up 06/28/2021   Tdap 06/28/2021   Zoster Recombinant(Shingrix ) 07/15/2023     7.  Glucose/Cholesterol:  Prediabetes stable with A1C is 5.8%.  Cholesterol  higher again with elevated LDL.   Lipid Panel     Component Value Date/Time   CHOL 218 (H) 07/16/2024 0906   TRIG 135 07/16/2024 0906   HDL 43 07/16/2024 0906   LDLCALC 151 (H) 07/16/2024 0906   LABVLDL 24 07/16/2024 0906   8.  Hypertension:  Has not used Lisinopril  for past 2 months.  Did not continue to refill.    No outpatient medications have been marked as taking for the 07/20/24 encounter (Office Visit) with Adella Norris, MD.   No Known Allergies   Review of Systems  HENT:  Negative for dental problem.   Eyes:  Negative for visual disturbance.  Respiratory:  Negative for shortness of breath.   Cardiovascular:  Negative for chest pain.  Musculoskeletal:  Positive for back pain (Burning in mid thoracic back bilaterally.  Started yesterday.  Had a headache all weekend.  Awakened with her right eye burning.  States may have had a bleed in lateral corner yesterday.  No fever, but did  feel hot 2 nights ago.  Vomited subsequently.).      Objective:   BP 124/78 (BP Location: Left Arm, Patient Position: Sitting, Cuff Size: Normal)   Pulse 72   Resp 20   Ht 4' 11.25 (1.505 m)   Wt 145 lb (65.8 kg)   LMP 09/25/2012   BMI 29.04 kg/m   Physical Exam HENT:     Head: Normocephalic and atraumatic.     Right Ear: Tympanic membrane, ear canal and external ear normal.     Left Ear: Tympanic membrane, ear canal and external ear normal.     Nose: Nose normal.     Mouth/Throat:     Mouth: Mucous membranes are moist.     Pharynx: Oropharynx is clear.  Eyes:     Extraocular Movements: Extraocular movements intact.     Pupils: Pupils are equal, round, and reactive to light.     Comments: Small right temporal subconjunctival blood   Neck:     Thyroid: No thyroid mass or thyromegaly.  Cardiovascular:     Rate and Rhythm: Normal rate and regular rhythm.     Heart sounds: S1 normal and S2 normal. No murmur heard.    No friction rub. No S3 or S4 sounds.     Comments: No carotid bruits.  Carotid, radial, femoral, DP and PT pulses normal and equal.  Pulmonary:     Effort: Pulmonary effort is normal.     Breath sounds: Normal breath sounds and air entry.  Chest:  Breasts:    Right: No inverted nipple, mass or nipple discharge.     Left: No inverted nipple, mass or nipple discharge.  Abdominal:     General: Bowel sounds are normal.     Palpations: Abdomen is soft. There is no hepatomegaly, splenomegaly or mass.     Tenderness: There is no abdominal tenderness.     Hernia: No hernia is present.  Genitourinary:    Comments: Normal external female genitalia Atrophy of vaginal mucosa No uterine or adnexal mass or tenderness Musculoskeletal:        General: Normal range of motion.     Cervical back: Normal range of motion and neck supple.     Right lower leg: No edema.     Left lower leg: No edema.  Lymphadenopathy:     Head:     Right side of head: No submental or  submandibular adenopathy.     Left side of head: No submental or submandibular adenopathy.     Cervical: No cervical adenopathy.     Upper Body:     Right upper body: No supraclavicular or axillary adenopathy.     Left upper body: No supraclavicular or axillary adenopathy.     Lower Body: No right inguinal adenopathy. No left inguinal adenopathy.  Skin:    General: Skin is warm.     Capillary Refill: Capillary refill takes less than 2 seconds.     Findings: No rash.  Neurological:     General: No focal deficit present.     Mental Status: She is alert and oriented to person, place, and time.     Cranial Nerves: Cranial nerves 2-12 are intact.     Sensory: Sensation is intact.     Motor: Motor function is intact.     Coordination: Coordination is intact.     Gait: Gait is intact.     Deep Tendon Reflexes: Reflexes are normal and symmetric.  Psychiatric:        Mood and Affect: Mood normal.        Speech: Speech normal.        Behavior: Behavior normal. Behavior is cooperative.      Assessment & Plan   CPE without pap FIT to return in 2 weeks. Mammogram ordered Influenza vaccine Declined rest of recommended vaccines:  pneumococcal 20, COVID, Shingrix   2.  Prediabetes:  continue to work on lifestyle changes.  Stable  3.  Hypercholesterolemia:  worsening--encouraged weekly goals to get cholesterol down with lifestyle changes.    4.  Small right subconjunctival hemorrhage.  Discussed should gradually resorb.  5.  Viral Syndrome:  supportive care, hydration, ibuprofen  as needed.  6.  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  Also, with SW for counseling  "

## 2024-07-27 ENCOUNTER — Other Ambulatory Visit (INDEPENDENT_AMBULATORY_CARE_PROVIDER_SITE_OTHER): Payer: Self-pay | Admitting: Internal Medicine

## 2024-07-27 ENCOUNTER — Ambulatory Visit: Payer: Self-pay | Admitting: Internal Medicine

## 2024-07-27 DIAGNOSIS — Z1211 Encounter for screening for malignant neoplasm of colon: Secondary | ICD-10-CM

## 2024-07-27 LAB — POC FIT TEST STOOL: Fecal Occult Blood: NEGATIVE

## 2024-08-03 ENCOUNTER — Ambulatory Visit: Payer: Self-pay | Admitting: Internal Medicine

## 2024-08-09 ENCOUNTER — Ambulatory Visit: Payer: Self-pay | Admitting: Internal Medicine

## 2025-01-18 ENCOUNTER — Other Ambulatory Visit: Payer: Self-pay

## 2025-07-26 ENCOUNTER — Other Ambulatory Visit: Payer: Self-pay | Admitting: Internal Medicine

## 2025-08-02 ENCOUNTER — Encounter: Payer: Self-pay | Admitting: Internal Medicine
